# Patient Record
Sex: Female | Born: 1980 | Race: White | Hispanic: No | Marital: Married | Smoking: Never smoker
Health system: Southern US, Community
[De-identification: ages and names within clinical notes are randomized; demographics above are authoritative.]

## PROBLEM LIST (undated history)

## (undated) DIAGNOSIS — R87629 Unspecified abnormal cytological findings in specimens from vagina: Secondary | ICD-10-CM

## (undated) DIAGNOSIS — Z8742 Personal history of other diseases of the female genital tract: Secondary | ICD-10-CM

## (undated) DIAGNOSIS — Z8719 Personal history of other diseases of the digestive system: Secondary | ICD-10-CM

## (undated) DIAGNOSIS — R42 Dizziness and giddiness: Secondary | ICD-10-CM

## (undated) DIAGNOSIS — I499 Cardiac arrhythmia, unspecified: Secondary | ICD-10-CM

## (undated) HISTORY — PX: COLONOSCOPY: SHX174

## (undated) HISTORY — PX: DIAGNOSTIC LAPAROSCOPY: SUR761

---

## 2003-04-16 ENCOUNTER — Other Ambulatory Visit: Admission: RE | Admit: 2003-04-16 | Discharge: 2003-04-16 | Payer: Self-pay | Admitting: Family Medicine

## 2005-06-14 ENCOUNTER — Other Ambulatory Visit: Admission: RE | Admit: 2005-06-14 | Discharge: 2005-06-14 | Payer: Self-pay | Admitting: Family Medicine

## 2006-07-30 ENCOUNTER — Other Ambulatory Visit: Admission: RE | Admit: 2006-07-30 | Discharge: 2006-07-30 | Payer: Self-pay | Admitting: Family Medicine

## 2007-08-09 ENCOUNTER — Other Ambulatory Visit: Admission: RE | Admit: 2007-08-09 | Discharge: 2007-08-09 | Payer: Self-pay | Admitting: Family Medicine

## 2008-03-02 ENCOUNTER — Other Ambulatory Visit: Admission: RE | Admit: 2008-03-02 | Discharge: 2008-03-02 | Payer: Self-pay | Admitting: Family Medicine

## 2008-08-28 ENCOUNTER — Other Ambulatory Visit: Admission: RE | Admit: 2008-08-28 | Discharge: 2008-08-28 | Payer: Self-pay | Admitting: Family Medicine

## 2009-08-31 ENCOUNTER — Other Ambulatory Visit: Admission: RE | Admit: 2009-08-31 | Discharge: 2009-08-31 | Payer: Self-pay | Admitting: Family Medicine

## 2010-10-24 ENCOUNTER — Other Ambulatory Visit: Payer: Self-pay | Admitting: Family Medicine

## 2010-10-24 ENCOUNTER — Other Ambulatory Visit (HOSPITAL_COMMUNITY)
Admission: RE | Admit: 2010-10-24 | Discharge: 2010-10-24 | Disposition: A | Discharge status: Home / Self Care | Payer: Self-pay | Source: Ambulatory Visit | Attending: Family Medicine | Admitting: Family Medicine

## 2010-10-24 DIAGNOSIS — Z01419 Encounter for gynecological examination (general) (routine) without abnormal findings: Secondary | ICD-10-CM | POA: Insufficient documentation

## 2011-04-14 ENCOUNTER — Encounter (HOSPITAL_COMMUNITY): Payer: Self-pay | Admitting: *Deleted

## 2011-04-14 ENCOUNTER — Encounter (HOSPITAL_COMMUNITY): Payer: Self-pay | Admitting: Pharmacist

## 2011-04-16 ENCOUNTER — Other Ambulatory Visit: Payer: Self-pay | Admitting: Obstetrics and Gynecology

## 2011-04-17 ENCOUNTER — Other Ambulatory Visit: Payer: Self-pay | Admitting: Obstetrics and Gynecology

## 2011-04-17 ENCOUNTER — Ambulatory Visit (HOSPITAL_COMMUNITY): Payer: PRIVATE HEALTH INSURANCE

## 2011-04-17 ENCOUNTER — Encounter (HOSPITAL_COMMUNITY): Payer: Self-pay | Admitting: Anesthesiology

## 2011-04-17 ENCOUNTER — Encounter (HOSPITAL_COMMUNITY)
Admission: RE | Disposition: A | Discharge status: Home / Self Care | Payer: Self-pay | Source: Ambulatory Visit | Attending: Obstetrics and Gynecology

## 2011-04-17 ENCOUNTER — Ambulatory Visit (HOSPITAL_COMMUNITY): Payer: PRIVATE HEALTH INSURANCE | Admitting: Anesthesiology

## 2011-04-17 ENCOUNTER — Ambulatory Visit (HOSPITAL_COMMUNITY)
Admission: RE | Admit: 2011-04-17 | Discharge: 2011-04-17 | Disposition: A | Discharge status: Home / Self Care | Payer: PRIVATE HEALTH INSURANCE | Source: Ambulatory Visit | Attending: Obstetrics and Gynecology | Admitting: Obstetrics and Gynecology

## 2011-04-17 ENCOUNTER — Encounter (HOSPITAL_COMMUNITY): Payer: Self-pay | Admitting: *Deleted

## 2011-04-17 DIAGNOSIS — O021 Missed abortion: Secondary | ICD-10-CM | POA: Insufficient documentation

## 2011-04-17 DIAGNOSIS — Z9889 Other specified postprocedural states: Secondary | ICD-10-CM

## 2011-04-17 HISTORY — PX: DILATION AND EVACUATION: SHX1459

## 2011-04-17 LAB — CBC
Hemoglobin: 13.4 g/dL (ref 12.0–15.0)
Platelets: 154 10*3/uL (ref 150–400)
RBC: 4.22 MIL/uL (ref 3.87–5.11)
WBC: 9.6 10*3/uL (ref 4.0–10.5)

## 2011-04-17 LAB — HCG, QUANTITATIVE, PREGNANCY: hCG, Beta Chain, Quant, S: 83027 m[IU]/mL — ABNORMAL HIGH (ref <5)

## 2011-04-17 SURGERY — DILATION AND EVACUATION, UTERUS
Anesthesia: Monitor Anesthesia Care | Site: Vagina | Wound class: Clean Contaminated

## 2011-04-17 MED ORDER — MIDAZOLAM HCL 2 MG/2ML IJ SOLN
INTRAMUSCULAR | Status: AC
  Filled 2011-04-17: qty 2

## 2011-04-17 MED ORDER — PROPOFOL 10 MG/ML IV EMUL
INTRAVENOUS | Status: DC | PRN
  Administered 2011-04-17 (×2): 20 mg via INTRAVENOUS
  Administered 2011-04-17: 30 mg via INTRAVENOUS

## 2011-04-17 MED ORDER — 0.9 % SODIUM CHLORIDE (POUR BTL) OPTIME
TOPICAL | Status: DC | PRN
  Administered 2011-04-17: 1000 mL

## 2011-04-17 MED ORDER — KETOROLAC TROMETHAMINE 30 MG/ML IJ SOLN
INTRAMUSCULAR | Status: AC
  Filled 2011-04-17: qty 1

## 2011-04-17 MED ORDER — DOXYCYCLINE HYCLATE 100 MG PO TABS: ORAL_TABLET | ORAL | Status: DC

## 2011-04-17 MED ORDER — FENTANYL CITRATE 0.05 MG/ML IJ SOLN: 25.0000 ug | INTRAMUSCULAR | Status: DC | PRN

## 2011-04-17 MED ORDER — BUPIVACAINE HCL (PF) 0.25 % IJ SOLN
INTRAMUSCULAR | Status: DC | PRN
  Administered 2011-04-17: 30 mL

## 2011-04-17 MED ORDER — ACETAMINOPHEN 325 MG PO TABS: 325.0000 mg | ORAL_TABLET | ORAL | Status: DC | PRN

## 2011-04-17 MED ORDER — ONDANSETRON HCL 4 MG/2ML IJ SOLN
INTRAMUSCULAR | Status: AC
  Filled 2011-04-17: qty 2

## 2011-04-17 MED ORDER — FENTANYL CITRATE 0.05 MG/ML IJ SOLN
INTRAMUSCULAR | Status: DC | PRN
  Administered 2011-04-17 (×2): 50 ug via INTRAVENOUS

## 2011-04-17 MED ORDER — OXYCODONE-ACETAMINOPHEN 5-325 MG PO TABS: 1.0000 | ORAL_TABLET | Freq: Four times a day (QID) | ORAL | Status: AC | PRN

## 2011-04-17 MED ORDER — FENTANYL CITRATE 0.05 MG/ML IJ SOLN
INTRAMUSCULAR | Status: AC
  Filled 2011-04-17: qty 2

## 2011-04-17 MED ORDER — DEXAMETHASONE SODIUM PHOSPHATE 10 MG/ML IJ SOLN
INTRAMUSCULAR | Status: AC
  Filled 2011-04-17: qty 1

## 2011-04-17 MED ORDER — MIDAZOLAM HCL 5 MG/5ML IJ SOLN
INTRAMUSCULAR | Status: DC | PRN
  Administered 2011-04-17: 2 mg via INTRAVENOUS

## 2011-04-17 MED ORDER — LIDOCAINE HCL (CARDIAC) 20 MG/ML IV SOLN
INTRAVENOUS | Status: AC
  Filled 2011-04-17: qty 5

## 2011-04-17 MED ORDER — LACTATED RINGERS IV SOLN
INTRAVENOUS | Status: DC
  Administered 2011-04-17 (×2): via INTRAVENOUS

## 2011-04-17 MED ORDER — ONDANSETRON HCL 4 MG/2ML IJ SOLN
INTRAMUSCULAR | Status: DC | PRN
  Administered 2011-04-17: 4 mg via INTRAVENOUS

## 2011-04-17 MED ORDER — PROPOFOL 10 MG/ML IV EMUL
INTRAVENOUS | Status: AC
  Filled 2011-04-17: qty 20

## 2011-04-17 MED ORDER — DEXAMETHASONE SODIUM PHOSPHATE 4 MG/ML IJ SOLN
INTRAMUSCULAR | Status: DC | PRN
  Administered 2011-04-17: 10 mg via INTRAVENOUS

## 2011-04-17 MED ORDER — LIDOCAINE HCL (CARDIAC) 20 MG/ML IV SOLN
INTRAVENOUS | Status: DC | PRN
  Administered 2011-04-17 (×2): 50 mg via INTRAVENOUS

## 2011-04-17 MED ORDER — KETOROLAC TROMETHAMINE 30 MG/ML IJ SOLN
INTRAMUSCULAR | Status: DC | PRN
  Administered 2011-04-17: 30 mg via INTRAVENOUS

## 2011-04-17 MED ORDER — KETOROLAC TROMETHAMINE 30 MG/ML IJ SOLN: 15.0000 mg | Freq: Once | INTRAMUSCULAR | Status: DC | PRN

## 2011-04-17 MED ORDER — PROMETHAZINE HCL 25 MG/ML IJ SOLN: 6.2500 mg | INTRAMUSCULAR | Status: DC | PRN

## 2011-04-17 MED ORDER — IBUPROFEN 800 MG PO TABS: 800.0000 mg | ORAL_TABLET | Freq: Three times a day (TID) | ORAL | Status: AC | PRN

## 2011-04-17 SURGICAL SUPPLY — 21 items
CATH ROBINSON RED A/P 16FR (CATHETERS) ×2 IMPLANT
CLOTH BEACON ORANGE TIMEOUT ST (SAFETY) ×2 IMPLANT
DECANTER SPIKE VIAL GLASS SM (MISCELLANEOUS) ×2 IMPLANT
GLOVE BIOGEL M 6.5 STRL (GLOVE) ×2 IMPLANT
GLOVE BIOGEL PI IND STRL 6.5 (GLOVE) ×2 IMPLANT
GLOVE BIOGEL PI INDICATOR 6.5 (GLOVE) ×2
GOWN PREVENTION PLUS LG XLONG (DISPOSABLE) ×2 IMPLANT
GOWN PREVENTION PLUS XLARGE (GOWN DISPOSABLE) ×2 IMPLANT
KIT BERKELEY 1ST TRIMESTER 3/8 (MISCELLANEOUS) ×2 IMPLANT
NDL SPNL 22GX3.5 QUINCKE BK (NEEDLE) ×1 IMPLANT
NEEDLE SPNL 22GX3.5 QUINCKE BK (NEEDLE) ×2 IMPLANT
NS IRRIG 1000ML POUR BTL (IV SOLUTION) ×2 IMPLANT
PACK VAGINAL MINOR WOMEN LF (CUSTOM PROCEDURE TRAY) ×2 IMPLANT
PAD PREP 24X48 CUFFED NSTRL (MISCELLANEOUS) ×2 IMPLANT
SET BERKELEY SUCTION TUBING (SUCTIONS) ×2 IMPLANT
SYR CONTROL 10ML LL (SYRINGE) ×2 IMPLANT
TOWEL OR 17X24 6PK STRL BLUE (TOWEL DISPOSABLE) ×4 IMPLANT
VACURETTE 10 RIGID CVD (CANNULA) ×1 IMPLANT
VACURETTE 7MM CVD STRL WRAP (CANNULA) IMPLANT
VACURETTE 8 RIGID CVD (CANNULA) IMPLANT
VACURETTE 9 RIGID CVD (CANNULA) IMPLANT

## 2011-04-17 NOTE — Op Note (Signed)
04/17/2011  8:10 AM  PATIENT:  Rachel Welch  31 y.o. female  PRE-OPERATIVE DIAGNOSIS:  missed abortion  POST-OPERATIVE DIAGNOSIS:  missed abortion  PROCEDURE:  Procedure(s): DILATATION AND  Suction [place]   SURGEON:  Surgeon(s): Dorien Chihuahua. Richardson Dopp, MD  PHYSICIAN ASSISTANT: None   ASSISTANTS: none   ANESTHESIA:   IV sedation  EBL:  Total I/O In: 1000 [I.V.:1000] Out: 300 [Urine:250; Blood:50]  BLOOD ADMINISTERED:none  DRAINS: none   LOCAL MEDICATIONS USED:  MARCAINE 20CC  SPECIMEN:  Source of Specimen:  Products of Conception   DISPOSITION OF SPECIMEN:  PATHOLOGY  COUNTS:  YES  TOURNIQUET:  * No tourniquets in log *  DICTATION: .Note written in EPIC  PLAN OF CARE: Discharge to home after PACU  PATIENT DISPOSITION:  PACU - hemodynamically stable.  EBL 50 cc UOP 250 cc Fluids per anesthesia  Indication: G1 P0 at 10 wks and 2 days with missed AB desires D&C   Procedure : Patient was taken to the operating room where she was identified as Rachel Welch. She was placed under IV sedation. She is placed in the dorsolithotomy position. She was prepped and draped in the usual sterile fashion. Specials placed into the vaginal vault. Anterior lip of the cervix was grasped with a single-tooth tenaculum. 10 cc of quarter percent Marcaine were injected at the Cornelius clock positions. The uterus was then sounded and found to be 11 cm. The cervix was cervix was dilated to approximately 10 mm. A 10 mm suction curettte  was introduced and the products of conception were removed without difficulty. This was performed under ultrasound guidance. A gentle sharp curettage was performed. The  suction curet was reintroduced. All products of conception were removed from the uterus. All instruments were removed from the vagina excellent hemostasis was noted.  Patient was awakened from anesthesia taken to recovery wakened in stable condition sponge lap and needle counts were correct x2"  thank you   Delay start of Pharmacological VTE agent (>24hrs) due to surgical blood loss or risk of bleeding:NA

## 2011-04-17 NOTE — Transfer of Care (Signed)
Immediate Anesthesia Transfer of Care Note  Patient: Rachel Welch  Procedure(s) Performed:  DILATATION AND EVACUATION - with ultrasound guidance  Patient Location: PACU  Anesthesia Type: MAC  Level of Consciousness: awake and sedated  Airway & Oxygen Therapy: Patient Spontanous Breathing  Post-op Assessment: Report given to PACU RN and Post -op Vital signs reviewed and stable  Post vital signs: Reviewed and stable Filed Vitals:   04/17/11 0628  BP: 118/64  Pulse: 92  Temp: 36.8 C  Resp: 16    Complications: No apparent anesthesia complications

## 2011-04-17 NOTE — H&P (Addendum)
History reviewed. No pertinent past surgical history.Date of Initial H&P:04/13/2011  History reviewed, patient examined, no change in status, stable for surgery. Blood positive  Is O positive

## 2011-04-17 NOTE — Anesthesia Preprocedure Evaluation (Signed)
Anesthesia Evaluation  Patient identified by MRN, date of birth, ID band Patient awake    Reviewed: Allergy & Precautions, H&P , Patient's Chart, lab work & pertinent test results, reviewed documented beta blocker date and time   History of Anesthesia Complications Negative for: history of anesthetic complications  Airway Mallampati: II TM Distance: >3 FB Neck ROM: full    Dental No notable dental hx.    Pulmonary neg pulmonary ROS,  clear to auscultation  Pulmonary exam normal       Cardiovascular Exercise Tolerance: Good neg cardio ROS regular Normal    Neuro/Psych Negative Neurological ROS  Negative Psych ROS   GI/Hepatic negative GI ROS, Neg liver ROS, IBS   Endo/Other  Negative Endocrine ROS  Renal/GU negative Renal ROS     Musculoskeletal   Abdominal   Peds  Hematology negative hematology ROS (+)   Anesthesia Other Findings   Reproductive/Obstetrics negative OB ROS                           Anesthesia Physical Anesthesia Plan  ASA: II  Anesthesia Plan: MAC   Post-op Pain Management:    Induction:   Airway Management Planned:   Additional Equipment:   Intra-op Plan:   Post-operative Plan:   Informed Consent: I have reviewed the patients History and Physical, chart, labs and discussed the procedure including the risks, benefits and alternatives for the proposed anesthesia with the patient or authorized representative who has indicated his/her understanding and acceptance.   Dental Advisory Given  Plan Discussed with: CRNA, Surgeon and Anesthesiologist  Anesthesia Plan Comments:         Anesthesia Quick Evaluation

## 2011-04-18 NOTE — Anesthesia Postprocedure Evaluation (Signed)
  Anesthesia Post-op Note  Patient: Rachel Welch  Procedure(s) Performed:  DILATATION AND EVACUATION - with ultrasound guidance  Patient is awake and responsive. Pain and nausea are reasonably well controlled. Vital signs are stable and clinically acceptable. Oxygen saturation is clinically acceptable. There are no apparent anesthetic complications at this time. Patient is ready for discharge.

## 2011-04-19 ENCOUNTER — Encounter (HOSPITAL_COMMUNITY): Payer: Self-pay | Admitting: Obstetrics and Gynecology

## 2012-01-12 ENCOUNTER — Other Ambulatory Visit: Payer: Self-pay | Admitting: Obstetrics and Gynecology

## 2012-01-12 ENCOUNTER — Other Ambulatory Visit (HOSPITAL_COMMUNITY)
Admission: RE | Admit: 2012-01-12 | Discharge: 2012-01-12 | Disposition: A | Discharge status: Home / Self Care | Payer: Commercial Managed Care - PPO | Source: Ambulatory Visit | Attending: Obstetrics and Gynecology | Admitting: Obstetrics and Gynecology

## 2012-01-12 DIAGNOSIS — Z01419 Encounter for gynecological examination (general) (routine) without abnormal findings: Secondary | ICD-10-CM | POA: Insufficient documentation

## 2012-04-03 DIAGNOSIS — R42 Dizziness and giddiness: Secondary | ICD-10-CM

## 2012-04-03 HISTORY — DX: Dizziness and giddiness: R42

## 2012-09-11 ENCOUNTER — Other Ambulatory Visit: Payer: Self-pay | Admitting: Otolaryngology

## 2012-09-11 DIAGNOSIS — R42 Dizziness and giddiness: Secondary | ICD-10-CM

## 2012-09-11 DIAGNOSIS — R269 Unspecified abnormalities of gait and mobility: Secondary | ICD-10-CM

## 2012-09-22 ENCOUNTER — Ambulatory Visit
Admission: RE | Admit: 2012-09-22 | Discharge: 2012-09-22 | Disposition: A | Discharge status: Home / Self Care | Payer: Commercial Managed Care - PPO | Source: Ambulatory Visit | Attending: Otolaryngology | Admitting: Otolaryngology

## 2012-09-22 DIAGNOSIS — R269 Unspecified abnormalities of gait and mobility: Secondary | ICD-10-CM

## 2012-09-22 DIAGNOSIS — R42 Dizziness and giddiness: Secondary | ICD-10-CM

## 2012-09-22 MED ORDER — GADOBENATE DIMEGLUMINE 529 MG/ML IV SOLN
10.0000 mL | Freq: Once | INTRAVENOUS | Status: AC | PRN
  Administered 2012-09-22: 10 mL via INTRAVENOUS

## 2012-11-25 ENCOUNTER — Ambulatory Visit
Payer: Commercial Managed Care - PPO | Attending: Otolaryngology | Admitting: Rehabilitative and Restorative Service Providers"

## 2012-11-25 DIAGNOSIS — R42 Dizziness and giddiness: Secondary | ICD-10-CM | POA: Insufficient documentation

## 2012-11-25 DIAGNOSIS — IMO0001 Reserved for inherently not codable concepts without codable children: Secondary | ICD-10-CM | POA: Insufficient documentation

## 2012-12-09 ENCOUNTER — Ambulatory Visit
Payer: Commercial Managed Care - PPO | Attending: Otolaryngology | Admitting: Rehabilitative and Restorative Service Providers"

## 2012-12-09 DIAGNOSIS — IMO0001 Reserved for inherently not codable concepts without codable children: Secondary | ICD-10-CM | POA: Insufficient documentation

## 2012-12-09 DIAGNOSIS — R42 Dizziness and giddiness: Secondary | ICD-10-CM | POA: Insufficient documentation

## 2012-12-31 ENCOUNTER — Ambulatory Visit: Payer: Commercial Managed Care - PPO | Admitting: Rehabilitative and Restorative Service Providers"

## 2013-06-02 ENCOUNTER — Other Ambulatory Visit (HOSPITAL_COMMUNITY)
Admission: RE | Admit: 2013-06-02 | Discharge: 2013-06-02 | Disposition: A | Discharge status: Home / Self Care | Payer: Commercial Managed Care - PPO | Source: Ambulatory Visit | Attending: Obstetrics and Gynecology | Admitting: Obstetrics and Gynecology

## 2013-06-02 ENCOUNTER — Other Ambulatory Visit: Payer: Self-pay | Admitting: Obstetrics and Gynecology

## 2013-06-02 DIAGNOSIS — Z01419 Encounter for gynecological examination (general) (routine) without abnormal findings: Secondary | ICD-10-CM | POA: Insufficient documentation

## 2013-06-02 DIAGNOSIS — Z1151 Encounter for screening for human papillomavirus (HPV): Secondary | ICD-10-CM | POA: Insufficient documentation

## 2014-03-14 LAB — OB RESULTS CONSOLE HEPATITIS B SURFACE ANTIGEN: Hepatitis B Surface Ag: NEGATIVE

## 2014-03-14 LAB — OB RESULTS CONSOLE ABO/RH: RH Type: POSITIVE

## 2014-03-14 LAB — OB RESULTS CONSOLE RPR: RPR: NONREACTIVE

## 2014-03-14 LAB — OB RESULTS CONSOLE HIV ANTIBODY (ROUTINE TESTING): HIV: NONREACTIVE

## 2014-03-14 LAB — OB RESULTS CONSOLE RUBELLA ANTIBODY, IGM: RUBELLA: IMMUNE

## 2014-03-14 LAB — OB RESULTS CONSOLE ANTIBODY SCREEN: Antibody Screen: NEGATIVE

## 2014-08-08 ENCOUNTER — Telehealth: Payer: Self-pay
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
## 2014-09-25 ENCOUNTER — Encounter (HOSPITAL_COMMUNITY): Payer: Self-pay

## 2014-09-25 ENCOUNTER — Other Ambulatory Visit: Payer: Self-pay | Admitting: Obstetrics and Gynecology

## 2014-09-25 NOTE — Patient Instructions (Addendum)
  Your procedure is scheduled on:09/29/14  Enter through the Main Entrance at :12:45 pm Pick up desk phone and dial [phone] and inform us of your arrival.  Please call [phone] if you have any problems the morning of surgery.  Remember: Do not eat food after midnight:tonight Clear liquids are ok until:10 am   You may brush your teeth the morning of surgery.  DO NOT wear jewelry, eye make-up, lipstick,body lotion, or dark fingernail polish.  (Polished toes are ok) You may wear deodorant.  If you are to be admitted after surgery, leave suitcase in car until your room has been assigned. Patients discharged on the day of surgery will not be allowed to drive home. Wear loose fitting, comfortable clothes for your ride home.

## 2014-09-28 ENCOUNTER — Encounter (HOSPITAL_COMMUNITY): Payer: Self-pay

## 2014-09-28 ENCOUNTER — Encounter (HOSPITAL_COMMUNITY)
Admission: RE | Admit: 2014-09-28 | Discharge: 2014-09-28 | Disposition: A | Discharge status: Home / Self Care | Payer: BLUE CROSS/BLUE SHIELD | Source: Ambulatory Visit | Attending: Obstetrics and Gynecology | Admitting: Obstetrics and Gynecology

## 2014-09-28 DIAGNOSIS — Z01818 Encounter for other preprocedural examination: Secondary | ICD-10-CM | POA: Insufficient documentation

## 2014-09-28 HISTORY — DX: Dizziness and giddiness: R42

## 2014-09-28 HISTORY — DX: Cardiac arrhythmia, unspecified: I49.9

## 2014-09-28 LAB — ABO/RH: ABO/RH(D): O POS

## 2014-09-28 LAB — CBC
HEMATOCRIT: 35.9 % — AB (ref 36.0–46.0)
HEMOGLOBIN: 12.2 g/dL (ref 12.0–15.0)
MCH: 32.2 pg (ref 26.0–34.0)
MCHC: 34 g/dL (ref 30.0–36.0)
MCV: 94.7 fL (ref 78.0–100.0)
Platelets: 113 10*3/uL — ABNORMAL LOW (ref 150–400)
RBC: 3.79 MIL/uL — ABNORMAL LOW (ref 3.87–5.11)
RDW: 13.5 % (ref 11.5–15.5)
WBC: 7.5 10*3/uL (ref 4.0–10.5)

## 2014-09-28 MED ORDER — GENTAMICIN SULFATE 40 MG/ML IJ SOLN
INTRAVENOUS | Status: AC
  Administered 2014-09-29: 100 mL via INTRAVENOUS
  Filled 2014-09-28: qty 7.25

## 2014-09-29 ENCOUNTER — Inpatient Hospital Stay (HOSPITAL_COMMUNITY): Payer: BLUE CROSS/BLUE SHIELD | Admitting: Certified Registered Nurse Anesthetist

## 2014-09-29 ENCOUNTER — Other Ambulatory Visit: Payer: Self-pay | Admitting: Obstetrics and Gynecology

## 2014-09-29 ENCOUNTER — Encounter (HOSPITAL_COMMUNITY)
Admission: RE | Disposition: A | Discharge status: Home / Self Care | Payer: Self-pay | Source: Ambulatory Visit | Attending: Obstetrics and Gynecology

## 2014-09-29 ENCOUNTER — Inpatient Hospital Stay (HOSPITAL_COMMUNITY)
Admission: RE | Admit: 2014-09-29 | Discharge: 2014-10-02 | DRG: 766 | Disposition: A | Discharge status: Home / Self Care | Payer: BLUE CROSS/BLUE SHIELD | Source: Ambulatory Visit | Attending: Obstetrics and Gynecology | Admitting: Obstetrics and Gynecology

## 2014-09-29 DIAGNOSIS — O4413 Placenta previa with hemorrhage, third trimester: Secondary | ICD-10-CM | POA: Diagnosis present

## 2014-09-29 DIAGNOSIS — O9081 Anemia of the puerperium: Secondary | ICD-10-CM | POA: Diagnosis not present

## 2014-09-29 DIAGNOSIS — D649 Anemia, unspecified: Secondary | ICD-10-CM | POA: Diagnosis not present

## 2014-09-29 DIAGNOSIS — O322XX0 Maternal care for transverse and oblique lie, not applicable or unspecified: Secondary | ICD-10-CM | POA: Diagnosis present

## 2014-09-29 DIAGNOSIS — Z3A37 37 weeks gestation of pregnancy: Secondary | ICD-10-CM | POA: Diagnosis present

## 2014-09-29 DIAGNOSIS — Z98891 History of uterine scar from previous surgery: Secondary | ICD-10-CM

## 2014-09-29 HISTORY — DX: Personal history of other diseases of the digestive system: Z87.19

## 2014-09-29 HISTORY — DX: Personal history of other diseases of the female genital tract: Z87.42

## 2014-09-29 LAB — CBC
HCT: 39.1 % (ref 36.0–46.0)
Hemoglobin: 13.6 g/dL (ref 12.0–15.0)
MCH: 33 pg (ref 26.0–34.0)
MCHC: 34.8 g/dL (ref 30.0–36.0)
MCV: 94.9 fL (ref 78.0–100.0)
PLATELETS: 104 10*3/uL — AB (ref 150–400)
RBC: 4.12 MIL/uL (ref 3.87–5.11)
RDW: 13.6 % (ref 11.5–15.5)
WBC: 8.9 10*3/uL (ref 4.0–10.5)

## 2014-09-29 LAB — HEMOGLOBIN AND HEMATOCRIT, BLOOD
HEMATOCRIT: 36.6 % (ref 36.0–46.0)
Hemoglobin: 12.5 g/dL (ref 12.0–15.0)

## 2014-09-29 LAB — PREPARE RBC (CROSSMATCH)

## 2014-09-29 LAB — RPR: RPR: NONREACTIVE

## 2014-09-29 SURGERY — Surgical Case
Anesthesia: Spinal | Site: Abdomen

## 2014-09-29 MED ORDER — SCOPOLAMINE 1 MG/3DAYS TD PT72
1.0000 | MEDICATED_PATCH | Freq: Once | TRANSDERMAL | Status: DC
  Administered 2014-09-29: 1.5 mg via TRANSDERMAL

## 2014-09-29 MED ORDER — SODIUM CHLORIDE 0.9 % IV SOLN: Freq: Once | INTRAVENOUS | Status: DC

## 2014-09-29 MED ORDER — OXYTOCIN 10 UNIT/ML IJ SOLN
40.0000 [IU] | INTRAVENOUS | Status: DC | PRN
  Administered 2014-09-29: 40 [IU] via INTRAVENOUS

## 2014-09-29 MED ORDER — LACTATED RINGERS IV SOLN
INTRAVENOUS | Status: DC | PRN
  Administered 2014-09-29: 15:00:00 via INTRAVENOUS

## 2014-09-29 MED ORDER — ACETAMINOPHEN 500 MG PO TABS
1000.0000 mg | ORAL_TABLET | Freq: Four times a day (QID) | ORAL | Status: AC
  Administered 2014-09-29: 1000 mg via ORAL
  Filled 2014-09-29: qty 2

## 2014-09-29 MED ORDER — WITCH HAZEL-GLYCERIN EX PADS: 1.0000 "application " | MEDICATED_PAD | CUTANEOUS | Status: DC | PRN

## 2014-09-29 MED ORDER — PHENYLEPHRINE 8 MG IN D5W 100 ML (0.08MG/ML) PREMIX OPTIME
INJECTION | INTRAVENOUS | Status: DC | PRN
  Administered 2014-09-29: 60 ug/min via INTRAVENOUS

## 2014-09-29 MED ORDER — MORPHINE SULFATE 0.5 MG/ML IJ SOLN
INTRAMUSCULAR | Status: AC
  Filled 2014-09-29: qty 10

## 2014-09-29 MED ORDER — LACTATED RINGERS IV SOLN
INTRAVENOUS | Status: DC
  Administered 2014-09-29: 23:00:00 via INTRAVENOUS

## 2014-09-29 MED ORDER — OXYCODONE-ACETAMINOPHEN 5-325 MG PO TABS
1.0000 | ORAL_TABLET | ORAL | Status: DC | PRN
  Administered 2014-09-30 – 2014-10-02 (×5): 1 via ORAL
  Filled 2014-09-29 (×5): qty 1

## 2014-09-29 MED ORDER — SODIUM CHLORIDE 0.9 % IJ SOLN: 3.0000 mL | INTRAMUSCULAR | Status: DC | PRN

## 2014-09-29 MED ORDER — OXYCODONE-ACETAMINOPHEN 5-325 MG PO TABS
2.0000 | ORAL_TABLET | ORAL | Status: DC | PRN
  Administered 2014-09-30 – 2014-10-01 (×4): 2 via ORAL
  Filled 2014-09-29 (×4): qty 2

## 2014-09-29 MED ORDER — METHYLERGONOVINE MALEATE 0.2 MG/ML IJ SOLN
INTRAMUSCULAR | Status: DC | PRN
  Administered 2014-09-29: 0.2 mg via INTRAMUSCULAR

## 2014-09-29 MED ORDER — ONDANSETRON HCL 4 MG/2ML IJ SOLN
INTRAMUSCULAR | Status: AC
  Filled 2014-09-29: qty 2

## 2014-09-29 MED ORDER — CARBOPROST TROMETHAMINE 250 MCG/ML IM SOLN
INTRAMUSCULAR | Status: DC | PRN
  Administered 2014-09-29: 250 ug via INTRAMUSCULAR

## 2014-09-29 MED ORDER — NALBUPHINE HCL 10 MG/ML IJ SOLN: 5.0000 mg | Freq: Once | INTRAMUSCULAR | Status: AC | PRN

## 2014-09-29 MED ORDER — METHYLERGONOVINE MALEATE 0.2 MG PO TABS
0.2000 mg | ORAL_TABLET | ORAL | Status: DC | PRN
Start: 2014-09-29 — End: 2014-10-02

## 2014-09-29 MED ORDER — PRENATAL MULTIVITAMIN CH
1.0000 | ORAL_TABLET | Freq: Every day | ORAL | Status: DC
  Administered 2014-10-01 – 2014-10-02 (×2): 1 via ORAL
  Filled 2014-09-29 (×3): qty 1

## 2014-09-29 MED ORDER — ACETAMINOPHEN 325 MG PO TABS: 650.0000 mg | ORAL_TABLET | ORAL | Status: DC | PRN

## 2014-09-29 MED ORDER — SCOPOLAMINE 1 MG/3DAYS TD PT72
MEDICATED_PATCH | TRANSDERMAL | Status: AC
  Administered 2014-09-29: 1.5 mg via TRANSDERMAL
  Filled 2014-09-29: qty 1

## 2014-09-29 MED ORDER — MEPERIDINE HCL 25 MG/ML IJ SOLN: 6.2500 mg | INTRAMUSCULAR | Status: DC | PRN

## 2014-09-29 MED ORDER — NALBUPHINE HCL 10 MG/ML IJ SOLN: 5.0000 mg | INTRAMUSCULAR | Status: DC | PRN

## 2014-09-29 MED ORDER — SIMETHICONE 80 MG PO CHEW: 80.0000 mg | CHEWABLE_TABLET | ORAL | Status: DC | PRN

## 2014-09-29 MED ORDER — LACTATED RINGERS IV SOLN
INTRAVENOUS | Status: DC | PRN
  Administered 2014-09-29 (×2): via INTRAVENOUS

## 2014-09-29 MED ORDER — METHYLERGONOVINE MALEATE 0.2 MG/ML IJ SOLN: 0.2000 mg | INTRAMUSCULAR | Status: DC | PRN

## 2014-09-29 MED ORDER — NALOXONE HCL 1 MG/ML IJ SOLN: 1.0000 ug/kg/h | INTRAVENOUS | Status: DC | PRN

## 2014-09-29 MED ORDER — ZOLPIDEM TARTRATE 5 MG PO TABS: 5.0000 mg | ORAL_TABLET | Freq: Every evening | ORAL | Status: DC | PRN

## 2014-09-29 MED ORDER — LANOLIN HYDROUS EX OINT
1.0000 | TOPICAL_OINTMENT | CUTANEOUS | Status: DC | PRN
Start: 2014-09-29 — End: 2014-10-02

## 2014-09-29 MED ORDER — FENTANYL CITRATE (PF) 100 MCG/2ML IJ SOLN
25.0000 ug | INTRAMUSCULAR | Status: DC | PRN
Start: 2014-09-29 — End: 2014-09-29
  Administered 2014-09-29: 50 ug via INTRAVENOUS

## 2014-09-29 MED ORDER — LACTATED RINGERS IV SOLN
Freq: Once | INTRAVENOUS | Status: AC
  Administered 2014-09-29: 13:00:00 via INTRAVENOUS

## 2014-09-29 MED ORDER — SCOPOLAMINE 1 MG/3DAYS TD PT72: 1.0000 | MEDICATED_PATCH | Freq: Once | TRANSDERMAL | Status: DC

## 2014-09-29 MED ORDER — PHENYLEPHRINE 8 MG IN D5W 100 ML (0.08MG/ML) PREMIX OPTIME
INJECTION | INTRAVENOUS | Status: AC
  Filled 2014-09-29: qty 100

## 2014-09-29 MED ORDER — DIPHENHYDRAMINE HCL 25 MG PO CAPS: 25.0000 mg | ORAL_CAPSULE | ORAL | Status: DC | PRN

## 2014-09-29 MED ORDER — NALOXONE HCL 0.4 MG/ML IJ SOLN
0.4000 mg | INTRAMUSCULAR | Status: DC | PRN
Start: 2014-09-29 — End: 2014-10-02

## 2014-09-29 MED ORDER — BUPIVACAINE IN DEXTROSE 0.75-8.25 % IT SOLN
INTRATHECAL | Status: DC | PRN
  Administered 2014-09-29: 1.4 mL via INTRATHECAL

## 2014-09-29 MED ORDER — SIMETHICONE 80 MG PO CHEW
80.0000 mg | CHEWABLE_TABLET | Freq: Three times a day (TID) | ORAL | Status: DC
  Administered 2014-09-30 – 2014-10-02 (×6): 80 mg via ORAL
  Filled 2014-09-29 (×6): qty 1

## 2014-09-29 MED ORDER — FENTANYL CITRATE (PF) 100 MCG/2ML IJ SOLN
INTRAMUSCULAR | Status: AC
  Filled 2014-09-29: qty 2

## 2014-09-29 MED ORDER — OXYTOCIN 10 UNIT/ML IJ SOLN
INTRAMUSCULAR | Status: AC
  Filled 2014-09-29: qty 4

## 2014-09-29 MED ORDER — SENNOSIDES-DOCUSATE SODIUM 8.6-50 MG PO TABS
2.0000 | ORAL_TABLET | ORAL | Status: DC
Start: 2014-09-30 — End: 2014-10-02
  Administered 2014-09-30 – 2014-10-01 (×3): 2 via ORAL
  Filled 2014-09-29 (×3): qty 2

## 2014-09-29 MED ORDER — ONDANSETRON HCL 4 MG/2ML IJ SOLN: 4.0000 mg | Freq: Three times a day (TID) | INTRAMUSCULAR | Status: DC | PRN

## 2014-09-29 MED ORDER — MORPHINE SULFATE (PF) 0.5 MG/ML IJ SOLN
INTRAMUSCULAR | Status: DC | PRN
  Administered 2014-09-29: .1 mg via INTRATHECAL

## 2014-09-29 MED ORDER — SIMETHICONE 80 MG PO CHEW
80.0000 mg | CHEWABLE_TABLET | ORAL | Status: DC
Start: 2014-09-30 — End: 2014-10-02
  Administered 2014-09-30 – 2014-10-02 (×3): 80 mg via ORAL
  Filled 2014-09-29 (×3): qty 1

## 2014-09-29 MED ORDER — FENTANYL CITRATE (PF) 100 MCG/2ML IJ SOLN
INTRAMUSCULAR | Status: DC | PRN
  Administered 2014-09-29: 50 ug via INTRAVENOUS
  Administered 2014-09-29: 25 ug via INTRATHECAL

## 2014-09-29 MED ORDER — DIPHENHYDRAMINE HCL 25 MG PO CAPS: 25.0000 mg | ORAL_CAPSULE | Freq: Four times a day (QID) | ORAL | Status: DC | PRN

## 2014-09-29 MED ORDER — MENTHOL 3 MG MT LOZG: 1.0000 | LOZENGE | OROMUCOSAL | Status: DC | PRN

## 2014-09-29 MED ORDER — ONDANSETRON HCL 4 MG/2ML IJ SOLN
INTRAMUSCULAR | Status: DC | PRN
Start: 2014-09-29 — End: 2014-09-29
  Administered 2014-09-29: 4 mg via INTRAVENOUS

## 2014-09-29 MED ORDER — FERROUS SULFATE 325 (65 FE) MG PO TABS
325.0000 mg | ORAL_TABLET | Freq: Two times a day (BID) | ORAL | Status: DC
  Administered 2014-09-30 – 2014-10-02 (×5): 325 mg via ORAL
  Filled 2014-09-29 (×5): qty 1

## 2014-09-29 MED ORDER — DIBUCAINE 1 % RE OINT: 1.0000 "application " | TOPICAL_OINTMENT | RECTAL | Status: DC | PRN

## 2014-09-29 MED ORDER — IBUPROFEN 600 MG PO TABS
600.0000 mg | ORAL_TABLET | Freq: Four times a day (QID) | ORAL | Status: DC
  Administered 2014-09-30 – 2014-10-02 (×10): 600 mg via ORAL
  Filled 2014-09-29 (×10): qty 1

## 2014-09-29 MED ORDER — 0.9 % SODIUM CHLORIDE (POUR BTL) OPTIME
TOPICAL | Status: DC | PRN
  Administered 2014-09-29: 2000 mL

## 2014-09-29 MED ORDER — OXYTOCIN 40 UNITS IN LACTATED RINGERS INFUSION - SIMPLE MED: 62.5000 mL/h | INTRAVENOUS | Status: AC

## 2014-09-29 MED ORDER — DIPHENHYDRAMINE HCL 50 MG/ML IJ SOLN
12.5000 mg | INTRAMUSCULAR | Status: DC | PRN
Start: 2014-09-29 — End: 2014-10-02

## 2014-09-29 SURGICAL SUPPLY — 43 items
APL SKNCLS STERI-STRIP NONHPOA (GAUZE/BANDAGES/DRESSINGS) ×1
BARRIER ADHS 3X4 INTERCEED (GAUZE/BANDAGES/DRESSINGS) ×2 IMPLANT
BENZOIN TINCTURE PRP APPL 2/3 (GAUZE/BANDAGES/DRESSINGS) ×3 IMPLANT
BRR ADH 4X3 ABS CNTRL BYND (GAUZE/BANDAGES/DRESSINGS) ×1
CLAMP CORD UMBIL (MISCELLANEOUS) IMPLANT
CLOSURE WOUND 1/2 X4 (GAUZE/BANDAGES/DRESSINGS) ×1
CLOTH BEACON ORANGE TIMEOUT ST (SAFETY) ×3 IMPLANT
CONTAINER PREFILL 10% NBF 15ML (MISCELLANEOUS) IMPLANT
DRAPE SHEET LG 3/4 BI-LAMINATE (DRAPES) ×2 IMPLANT
DRSG OPSITE POSTOP 4X10 (GAUZE/BANDAGES/DRESSINGS) ×3 IMPLANT
DURAPREP 26ML APPLICATOR (WOUND CARE) ×3 IMPLANT
ELECT REM PT RETURN 9FT ADLT (ELECTROSURGICAL) ×3
ELECTRODE REM PT RTRN 9FT ADLT (ELECTROSURGICAL) ×1 IMPLANT
EXTRACTOR VACUUM M CUP 4 TUBE (SUCTIONS) IMPLANT
EXTRACTOR VACUUM M CUP 4' TUBE (SUCTIONS)
GLOVE BIOGEL M 6.5 STRL (GLOVE) ×6 IMPLANT
GLOVE BIOGEL PI IND STRL 6.5 (GLOVE) ×1 IMPLANT
GLOVE BIOGEL PI INDICATOR 6.5 (GLOVE) ×2
GOWN STRL REUS W/TWL LRG LVL3 (GOWN DISPOSABLE) ×9 IMPLANT
KIT ABG SYR 3ML LUER SLIP (SYRINGE) IMPLANT
NDL HYPO 25X5/8 SAFETYGLIDE (NEEDLE) IMPLANT
NEEDLE HYPO 25X5/8 SAFETYGLIDE (NEEDLE) IMPLANT
NS IRRIG 1000ML POUR BTL (IV SOLUTION) ×3 IMPLANT
PACK C SECTION WH (CUSTOM PROCEDURE TRAY) ×3 IMPLANT
PAD ABD 7.5X8 STRL (GAUZE/BANDAGES/DRESSINGS) ×2 IMPLANT
PAD OB MATERNITY 4.3X12.25 (PERSONAL CARE ITEMS) ×3 IMPLANT
RTRCTR C-SECT PINK 25CM LRG (MISCELLANEOUS) ×2 IMPLANT
RTRCTR C-SECT PINK 34CM XLRG (MISCELLANEOUS) IMPLANT
SPONGE GAUZE 4X4 12PLY STER LF (GAUZE/BANDAGES/DRESSINGS) ×4 IMPLANT
STAPLER VISISTAT 35W (STAPLE) IMPLANT
STRIP CLOSURE SKIN 1/2X4 (GAUZE/BANDAGES/DRESSINGS) ×2 IMPLANT
SUT PDS AB 0 CT1 27 (SUTURE) ×6 IMPLANT
SUT PLAIN 0 NONE (SUTURE) IMPLANT
SUT VIC AB 0 CTX 36 (SUTURE) ×15
SUT VIC AB 0 CTX36XBRD ANBCTRL (SUTURE) ×3 IMPLANT
SUT VIC AB 2-0 CT1 27 (SUTURE) ×3
SUT VIC AB 2-0 CT1 TAPERPNT 27 (SUTURE) ×1 IMPLANT
SUT VIC AB 3-0 SH 27 (SUTURE) ×3
SUT VIC AB 3-0 SH 27X BRD (SUTURE) IMPLANT
SUT VIC AB 4-0 KS 27 (SUTURE) ×3 IMPLANT
TAPE CLOTH SURG 4X10 WHT LF (GAUZE/BANDAGES/DRESSINGS) ×2 IMPLANT
TOWEL OR 17X24 6PK STRL BLUE (TOWEL DISPOSABLE) ×3 IMPLANT
TRAY FOLEY CATH SILVER 14FR (SET/KITS/TRAYS/PACK) ×3 IMPLANT

## 2014-09-29 NOTE — Op Note (Addendum)
Cesarean Section Procedure Note  Indications: placenta previa  Pre-operative Diagnosis: 37 week 0 day pregnancy.  Post-operative Diagnosis: same  Surgeon: Jessee AversOLE,[place]odia [place] J. M.D.  Assistants: Dr. Geryl RankinsEvelyn Varnado  asisted due to the complexity of the surgery and concern for massive  Hemorrhage due to previa .   Anesthesia: Spinal anesthesia  ASA Class: 2   Procedure Details   The patient was seen in the Holding Room. The risks, benefits, complications, treatment options, and expected outcomes were discussed with the patient.  The patient concurred with the proposed plan, giving informed consent.  The site of surgery properly noted/marked. The patient was taken to Operating Room # 9, identified as Rachel Welch and the procedure verified as C-Section Delivery. A Time Out was held and the above information confirmed.  After induction of anesthesia, the patient was draped and prepped in the usual sterile manner. A Pfannenstiel incision was made and carried down through the subcutaneous tissue to the fascia. Fascial incision was made and extended transversely. The fascia was separated from the underlying rectus tissue superiorly and inferiorly. The peritoneum was identified and entered. Peritoneal incision was extended longitudinally. The utero-vesical peritoneal reflection was incised transversely and the bladder flap was bluntly freed from the lower uterine segment. A low transverse uterine incision was made. Delivered from transverse presentation was a  Female with Apgar scores of 9 at one minute and 9 at five minutes. After the umbilical cord was clamped and cut cord blood was obtained for evaluation. The placenta was removed intact and appeared normal. The uterine outline, tubes and ovaries appeared normal.  She had uterine atony.. Methergine 0.2 mg was given IM and 1 amp of hemabate was given IM. The uterine incision was closed with running locked sutures of 0 vicryl. a second layer of the same  suture was used to imbricate the incision . Hemostasis was observed. Lavage was carried out until clear. The fascia was then reapproximated with running sutures of 0 pds. The skin was reapproximated with 4-0 vicryl .  Instrument, sponge, and needle counts were correct prior the abdominal closure and at the conclusion of the case.   Findings: Female infant in the transverse presentation.  Normal fallopian tubes and ovaries.   Estimated Blood Loss:  1200 CC         Drains: Foley  UOP 100 cc          Total IV Fluids:  Per anesthesia ml         Specimens: Placenta and sent to labor and delivery            Implants: None         Complications:  None; patient tolerated the procedure well.         Disposition: PACU - hemodynamically stable.         Condition: stable  Attending Attestation: I performed the procedure.

## 2014-09-29 NOTE — H&P (Deleted)
  The note originally documented on this encounter has been moved the the encounter in which it belongs.  

## 2014-09-29 NOTE — H&P (Signed)
Rachel Welch is a 34 y.o. female presenting for  Primary cesarean section at [redacted] wks EGA based on LMP confirmed by u/s with EDD 10/20/2014. Pregnacy complicated by Complete placenta previa.She has malpresentation transverse . Pt has not had any vaginal bleeding during her pregnancy. + FM no contractions.   History OB History    Gravida Para Term Preterm AB TAB SAB Ectopic Multiple Living   2    1     0     Past Medical History  Diagnosis Date  . Dysrhythmia     occ skipped beat  . Vertigo 2014    x 6 mos   Past Surgical History  Procedure Laterality Date  . Dilation and evacuation  04/17/2011    Procedure: DILATATION AND EVACUATION;  Surgeon: Dorien Chihuahuaara J. Richardson Doppole, MD;  Location: [facility];  Service: Gynecology;  Laterality: N/A;  with ultrasound guidance  . Diagnostic laparoscopy     Family History: family history is not on file. Social History:  reports that she has never smoked. She does not have any smokeless tobacco history on file. She reports that she does not drink alcohol or use illicit drugs. Allergies : sulfa and Penicillin   Prenatal Transfer Tool  Maternal Diabetes: No Genetic Screening: Normal Maternal Ultrasounds/Referrals: Abnormal:  Findings:   Other: placenta previa  Fetal Ultrasounds or other Referrals:  None Maternal Substance Abuse:  No Significant Maternal Medications:  None Significant Maternal Lab Results:  Lab values include: Group B Strep negative Other Comments:  None  Review of Systems  Constitutional: Negative.   HENT: Negative.   Eyes: Negative.   Respiratory: Negative.   Cardiovascular: Negative.   Gastrointestinal: Negative.   Genitourinary: Negative.   Musculoskeletal: Negative.   Skin: Negative.   Neurological: Negative.   Endo/Heme/Allergies: Negative.   Psychiatric/Behavioral: Negative.       Last menstrual period 01/15/2014. Exam Physical Exam  Vitals reviewed. Constitutional: She is oriented to person, place, and time. She appears  well-developed and well-nourished.  HENT:  Head: Normocephalic and atraumatic.  Neck: Normal range of motion. Neck supple.  Cardiovascular: Normal rate and regular rhythm.   Respiratory: Effort normal and breath sounds normal.  GI: There is no tenderness.  Musculoskeletal: Normal range of motion. She exhibits no edema.  Neurological: She is alert and oriented to person, place, and time. She has normal reflexes.  Skin: Skin is warm and dry.  Psychiatric: She has a normal mood and affect.    Prenatal labs: ABO, Rh: --/--/O POS, O POS (06/27 1225) Antibody: NEG (06/27 1225) Rubella: Immune (12/12 0000) RPR: Non Reactive (06/27 1225)  HBsAg: Negative (12/12 0000)  HIV: Non-reactive (12/12 0000)  GBS:   Negative   Assessment/Plan: 37 wks   And 0 days EGA with complete previa and malpresentation of fetus.  Plan primary cesarean section. D/w pt r/o surgery infection/ bleeding/ damage to bowel bladder baby with the need for further surgery. R/o Transfusion HIV / hep B&C discussed.  Pt informed of increased r/o accreta with previa and possible need for cesarean hysterectomy.  She voiced understanding and accepts the above risk. She desires to proceed.    Rachel Welch J. 09/29/2014, 12:44 PM

## 2014-09-29 NOTE — Progress Notes (Signed)
Pt dizzy after 5 min standing at bedside at 2055 at 6hrs after delivery. Low B/P when standing back to bed for pad change.

## 2014-09-29 NOTE — Anesthesia Procedure Notes (Signed)

## 2014-09-29 NOTE — Anesthesia Preprocedure Evaluation (Addendum)
Anesthesia Evaluation  Patient identified by MRN, date of birth, ID band Patient awake    Reviewed: Allergy & Precautions, H&P , Patient's Chart, lab work & pertinent test results  Airway Mallampati: II  TM Distance: >3 FB Neck ROM: full    Dental no notable dental hx.    Pulmonary  breath sounds clear to auscultation  Pulmonary exam normal       Cardiovascular Exercise Tolerance: Good Rhythm:regular Rate:Normal     Neuro/Psych    GI/Hepatic   Endo/Other    Renal/GU      Musculoskeletal   Abdominal   Peds  Hematology   Anesthesia Other Findings Discussed low platelets and asked Dr Richardson Doppole if she wanted to wait till platelets were here. She wishes to proceed with CS  Reproductive/Obstetrics                           Anesthesia Physical Anesthesia Plan  ASA: II  Anesthesia Plan: Spinal   Post-op Pain Management:    Induction:   Airway Management Planned:   Additional Equipment:   Intra-op Plan:   Post-operative Plan:   Informed Consent: I have reviewed the patients History and Physical, chart, labs and discussed the procedure including the risks, benefits and alternatives for the proposed anesthesia with the patient or authorized representative who has indicated his/her understanding and acceptance.   Dental Advisory Given  Plan Discussed with: CRNA  Anesthesia Plan Comments: (Lab work confirmed with CRNA in room. Platelets okay. Discussed spinal anesthetic, and patient consents to the procedure:  included risk of possible headache,backache, failed block, allergic reaction, and nerve injury. This patient was asked if she had any questions or concerns before the procedure started. )        Anesthesia Quick Evaluation

## 2014-09-29 NOTE — Transfer of Care (Signed)
Immediate Anesthesia Transfer of Care Note  Patient: Rachel Welch  Procedure(s) Performed: Procedure(s): CESAREAN SECTION (N/A)  Patient Location: PACU  Anesthesia Type:Spinal  Level of Consciousness: awake, alert  and oriented  Airway & Oxygen Therapy: Patient Spontanous Breathing  Post-op Assessment: Report given to RN and Post -op Vital signs reviewed and stable  Post vital signs: Reviewed and stable  Last Vitals:  Filed Vitals:   09/29/14 1245  Pulse: 71  Temp: 36.5 C  Resp: 20    Complications: No apparent anesthesia complications

## 2014-09-30 ENCOUNTER — Encounter (HOSPITAL_COMMUNITY): Payer: Self-pay | Admitting: *Deleted

## 2014-09-30 LAB — CBC
HCT: 27.4 % — ABNORMAL LOW (ref 36.0–46.0)
Hemoglobin: 9.3 g/dL — ABNORMAL LOW (ref 12.0–15.0)
MCH: 31.7 pg (ref 26.0–34.0)
MCHC: 33.9 g/dL (ref 30.0–36.0)
MCV: 93.5 fL (ref 78.0–100.0)
Platelets: 94 10*3/uL — ABNORMAL LOW (ref 150–400)
RBC: 2.93 MIL/uL — AB (ref 3.87–5.11)
RDW: 13.5 % (ref 11.5–15.5)
WBC: 11.8 10*3/uL — AB (ref 4.0–10.5)

## 2014-09-30 LAB — BIRTH TISSUE RECOVERY COLLECTION (PLACENTA DONATION)

## 2014-09-30 NOTE — Lactation Note (Signed)
This note was copied from the chart of Boy Beatriz ChancellorElizabeth Sayavong. Lactation Consultation Note  Baby was in nursery for circumcision. Hand expression taught to mom with colostrum easily expressible.  Baby is 37 weeks and <6# so hand expression and spoon feeding was encouraged.  Follow-up after circumcision.  Patient Name: Boy Beatriz Chancellorlizabeth Grimes [id number]'s Date: 09/30/2014 Reason for consult: Initial assessment   Maternal Data Has patient been taught Hand Expression?: Yes Does the patient have breastfeeding experience prior to this delivery?: No  Feeding    LATCH Score/Interventions                      Lactation Tools Discussed/Used     Consult Status      Soyla DryerJoseph, Jamise Pentland 09/30/2014, 12:41 PM

## 2014-09-30 NOTE — Progress Notes (Signed)
Subjective: Postpartum Day 1: Cesarean Delivery Patient reports incisional pain and tolerating PO.   Pain improved with 1 percocet.   Objective: Vital signs in last 24 hours: Temp:  [97.3 F (36.3 C)-98.5 F (36.9 C)] 97.8 F (36.6 C) (06/29 0546) Pulse Rate:  [67-90] 68 (06/29 0546) Resp:  [10-24] 20 (06/29 0546) BP: (74-129)/(42-88) 88/46 mmHg (06/29 0546) SpO2:  [94 %-100 %] 94 % (06/29 0546)  Physical Exam:  General: alert and cooperative Lochia: appropriate Uterine Fundus: firm Incision: bandage clean dry  And intact DVT Evaluation: No evidence of DVT seen on physical exam.   Recent Labs  09/29/14 1635 09/30/14 0620  HGB 12.5 9.3*  HCT 36.6 27.4*    Assessment/Plan: Status post Cesarean section. Doing well postoperatively.  Anemia.. Iron supplement BID  Encouraged ambulation.  Pt desires circumcision of her son. R/b/a of circumcision discussed. [name].  Maelie Chriswell J. 09/30/2014, 12:14 PM

## 2014-10-01 LAB — PREPARE PLATELET PHERESIS: Unit division: 0

## 2014-10-01 NOTE — Discharge Summary (Signed)
Obstetric Discharge Summary Reason for Admission: cesarean section Prenatal Procedures: none Intrapartum Procedures: cesarean: low cervical, transverse Postpartum Procedures: none Complications-Operative and Postpartum: hemorrhage HEMOGLOBIN  Date Value Ref Range Status  09/30/2014 9.3* 12.0 - 15.0 g/dL Final    Comment:    REPEATED TO VERIFY DELTA CHECK NOTED    HCT  Date Value Ref Range Status  09/30/2014 27.4* 36.0 - 46.0 % Final     Discharge Diagnoses: Term Pregnancy-delivered  Discharge Information: Date: 10/02/2014  Activity: pelvic rest Diet: routine Medications: PNV, Ibuprofen, Iron and Percocet Condition: stable Instructions: refer to practice specific booklet Discharge to: home Follow-up Information    Follow up with Jessee AversOLE,Taylen Wendland J., MD. Schedule an appointment as soon as possible for a visit in 2 weeks.   Specialty:  Obstetrics and Gynecology   Why:  incision check    Contact information:   [address] [place] [address] [phone]       Newborn Data: Live born female  Birth Weight: 5 lb 13.8 oz (2659 g) APGAR: 9, 9  Home with mother.  Kore Madlock J. 10/01/2014, 9:38 PM

## 2014-10-01 NOTE — Lactation Note (Signed)
This note was copied from the chart of [name]'s Date: 10/01/2014 Reason for consult: Follow-up assessment;Infant < 6lbs;Other (Comment) (37.0 GA)   Follow-up consult at 51 hours; GA 37.0; BW 5 lbs, 13.8 oz.  Mom is a P1.  Only 4% weight loss last night at 33 hours old. Infant has breastfed x7 (15-35 min) + attempts x2 (0 min) in past 24 hours; voids-4 in 24 hours/7 life; stools-3 in 24 hours/ 9 life.  LS-8 by RN. RN had just brought HP into room to give mom when LC came in.  Mom's right nipple dimples inward and infant has a difficult time maintaining latch. [facility] taught mom how to use Hand pump for a few minutes and right nipple erected. LC reviewed hand expression and taught mom to latch with asymmetrical latching technique.   Infant latched on right breast Football hold, took a few sucks and would lose latch.  Mom's breasts are filling/firm so infant was having difficulty maintaining latch.   After a few on/off episodes d/t mom's firm breasts, infant latched and stayed latched.   Some chomping motion noted but after milk began flowing and chin tug, infant sucked in a consistent rhythm with several swallows heard throughout feeding.  LS-9.  Taught dad how to assist with latching using teacup hold and supporting mom's hands.  Taught dad how to tug on chin to increase depth on breast and flange bottom lips.   Gave parents curved tip syringe for using EBM collected and giving back EBM at breast.  Discussed how to use curved-tip at breast. Mom reports hand expressing and feeding back 1-2 ml of colostrum to infant via spoon.  Mom had a colostrum collection container at sink with ~1-2 ml collected.    Encouraged parents to continue feeding with cues, but also waking every 3 hours if infant is sleeping for feedings since infant is 37.0 GA & <6 lbs.  Engorgement prevention discussed.    Parents verbalized understanding  and was appreciative of teaching/help from [facility]. Encouraged parents to call for assistance as necessary.  Infant was still feeding consistently when LC left room 30 minutes later.   Report of consult given to RN.     Maternal Data Has patient been taught Hand Expression?: Yes  Feeding Feeding Type: Breast Fed Length of feed: 30 min  LATCH Score/Interventions Latch: Grasps breast easily, tongue down, lips flanged, rhythmical sucking.  Audible Swallowing: Spontaneous and intermittent  Type of Nipple: Everted at rest and after stimulation (right nipple dimples in center but will erect with stimulation) Intervention(s): Hand pump  Comfort (Breast/Nipple): Soft / non-tender     Hold (Positioning): Assistance needed to correctly position infant at breast and maintain latch.  LATCH Score: 9  Lactation Tools Discussed/Used Pump Review: Setup, frequency, and cleaning;Milk Storage Initiated by:: RN Date initiated:: 10/01/14   Consult Status Consult Status: Follow-up Date: 10/02/14 Follow-up type: In-patient    Lendon KaVann, Lorrain Rivers Walker 10/01/2014, 6:32 PM

## 2014-10-01 NOTE — Anesthesia Postprocedure Evaluation (Signed)
  Anesthesia Post-op Note  Patient: Rachel ChancellorElizabeth Welch  Procedure(s) Performed: Procedure(s): CESAREAN SECTION (N/A)  Patient is awake, responsive, moving her legs, and has signs of resolution of her numbness. Pain and nausea are reasonably well controlled. Vital signs are stable and clinically acceptable. Oxygen saturation is clinically acceptable. There are no apparent anesthetic complications at this time. Patient is ready for discharge.

## 2014-10-01 NOTE — Progress Notes (Signed)
Subjective: Postpartum Day 2: Cesarean Delivery Patient reports tolerating PO, + flatus and no problems voiding.    Objective: Vital signs in last 24 hours: Temp:  [98 F (36.7 C)-98.5 F (36.9 C)] 98 F (36.7 C) (06/30 0615) Pulse Rate:  [77-81] 81 (06/30 0615) Resp:  [16-18] 16 (06/30 0615) BP: (92-99)/(42-51) 99/51 mmHg (06/30 0615)  Physical Exam:  General: alert and cooperative Lochia: appropriate Uterine Fundus: firm Incision: healing well DVT Evaluation: No evidence of DVT seen on physical exam.   Recent Labs  09/29/14 1635 09/30/14 0620  HGB 12.5 9.3*  HCT 36.6 27.4*    Assessment/Plan: Status post Cesarean section. Doing well postoperatively.  Continue current care Plan discharge home tomorrow. Dr. Dion BodyVarnado covering 10/02/2014  Rachel Weismann J. 10/01/2014, 1:59 PM

## 2014-10-02 LAB — TYPE AND SCREEN
ABO/RH(D): O POS
Antibody Screen: NEGATIVE
UNIT DIVISION: 0
UNIT DIVISION: 0

## 2014-10-02 MED ORDER — IBUPROFEN 600 MG PO TABS: 600.0000 mg | ORAL_TABLET | Freq: Four times a day (QID) | ORAL | Status: DC

## 2014-10-02 MED ORDER — OXYCODONE-ACETAMINOPHEN 5-325 MG PO TABS: ORAL_TABLET | ORAL | Status: DC

## 2014-10-02 NOTE — Lactation Note (Addendum)
This note was copied from the chart of [name]. Lactation Consultation Note  Patient Name: [name] ZOXWR'UToday's Date: 10/02/2014 Reason for consult: Follow-up assessment  with this mom of an early term baby, now 8237 4/7 weeks CGA. Mom has a great milk supply - she has pumped 40-50 ml's of transitional milk.  Mom has been supplementing with syringe, but was willing to use bottle with slow flow nipple today. i advised them to offer 30 ml's, and to let him tke the amount he wants. They can increase amount from now on, as baby cues for.  Mom is extremely engorged. I showed her how to do massage from her nipple towards her armpit, using vaseline in hostal, but coconut or olive oil at home. I also gave mom ice and instructed her in it's use. I advised mom to stop massage and pump if her milk begins to flow. She will call for help with nipple shield application, and latching, later this morning. I also brought mom comofort gels, and will instruct her in their use also.  Dad is bottle feeding the baby, and was shown side lying position.  Follow up with this mom after pumping - she expressed at least 50 mls of transitional milk, Her left breast is much softer, but her right is still firm. Mom was advised to pump at home every 2-3 hours, and to attempt to latch the baby as often as she wants to, but to focus today on softening her breasts, getting her milk flowing, and feedng the baby - even if this means bottle feeding EBm for today. Mom has an appointment with Lactation at her ped sofice. Mom knows to call for questions/concerns, as needed.    Maternal Data    Feeding Feeding Type: Bottle Fed - Breast Milk Nipple Type: Slow - flow  LATCH Score/Interventions       Type of Nipple: Everted at rest and after stimulation (right also semi inverted)  Comfort (Breast/Nipple): Filling, red/small blisters or bruises, mild/mod discomfort Problem noted: Engorgment Intervention(s): Ice;Hand  expression  Problem noted: Mild/Moderate discomfort Interventions (Mild/moderate discomfort): Comfort gels  Hold (Positioning): Assistance needed to correctly position infant at breast and maintain latch.     Lactation Tools Discussed/Used Tools: Pump;Nipple Shields Nipple shield size: 24 Breast pump type: Double-Electric Breast Pump   Consult Status Consult Status: Follow-up Date: 10/02/14 Follow-up type: In-patient    [name], [name] 10/02/2014, 8:59 AM

## 2014-10-02 NOTE — Discharge Instructions (Signed)

## 2015-03-26 ENCOUNTER — Other Ambulatory Visit: Payer: Self-pay | Admitting: Obstetrics and Gynecology

## 2015-03-26 ENCOUNTER — Other Ambulatory Visit (HOSPITAL_COMMUNITY)
Admission: RE | Admit: 2015-03-26 | Discharge: 2015-03-26 | Disposition: A | Discharge status: Home / Self Care | Payer: BLUE CROSS/BLUE SHIELD | Source: Ambulatory Visit | Attending: Obstetrics and Gynecology | Admitting: Obstetrics and Gynecology

## 2015-03-26 DIAGNOSIS — Z01419 Encounter for gynecological examination (general) (routine) without abnormal findings: Secondary | ICD-10-CM | POA: Insufficient documentation

## 2015-03-30 LAB — CYTOLOGY - PAP

## 2016-05-02 ENCOUNTER — Other Ambulatory Visit (HOSPITAL_COMMUNITY)
Admission: RE | Admit: 2016-05-02 | Discharge: 2016-05-02 | Disposition: A | Discharge status: Home / Self Care | Payer: 59 | Source: Ambulatory Visit | Attending: Obstetrics and Gynecology | Admitting: Obstetrics and Gynecology

## 2016-05-02 ENCOUNTER — Other Ambulatory Visit: Payer: Self-pay | Admitting: Obstetrics and Gynecology

## 2016-05-02 DIAGNOSIS — Z01419 Encounter for gynecological examination (general) (routine) without abnormal findings: Secondary | ICD-10-CM | POA: Diagnosis not present

## 2016-05-03 LAB — CYTOLOGY - PAP: DIAGNOSIS: NEGATIVE

## 2017-01-24 LAB — OB RESULTS CONSOLE HEPATITIS B SURFACE ANTIGEN: HEP B S AG: NEGATIVE

## 2017-01-24 LAB — OB RESULTS CONSOLE ABO/RH: RH TYPE: POSITIVE

## 2017-01-24 LAB — OB RESULTS CONSOLE HIV ANTIBODY (ROUTINE TESTING): HIV: NONREACTIVE

## 2017-01-24 LAB — OB RESULTS CONSOLE ANTIBODY SCREEN: Antibody Screen: NEGATIVE

## 2017-01-24 LAB — OB RESULTS CONSOLE RPR: RPR: NONREACTIVE

## 2017-01-24 LAB — OB RESULTS CONSOLE GC/CHLAMYDIA
Chlamydia: NEGATIVE
Gonorrhea: NEGATIVE

## 2017-01-24 LAB — OB RESULTS CONSOLE RUBELLA ANTIBODY, IGM: RUBELLA: IMMUNE

## 2017-04-16 DIAGNOSIS — O09522 Supervision of elderly multigravida, second trimester: Secondary | ICD-10-CM | POA: Diagnosis not present

## 2017-04-16 DIAGNOSIS — Z3A18 18 weeks gestation of pregnancy: Secondary | ICD-10-CM | POA: Diagnosis not present

## 2017-05-04 ENCOUNTER — Other Ambulatory Visit: Payer: Self-pay | Admitting: Obstetrics and Gynecology

## 2017-05-04 DIAGNOSIS — R1084 Generalized abdominal pain: Secondary | ICD-10-CM

## 2017-05-07 ENCOUNTER — Ambulatory Visit (HOSPITAL_COMMUNITY)
Admission: RE | Admit: 2017-05-07 | Discharge: 2017-05-07 | Disposition: A | Discharge status: Home / Self Care | Payer: 59 | Source: Ambulatory Visit | Attending: Obstetrics and Gynecology | Admitting: Obstetrics and Gynecology

## 2017-05-07 DIAGNOSIS — R1011 Right upper quadrant pain: Secondary | ICD-10-CM | POA: Insufficient documentation

## 2017-05-07 DIAGNOSIS — R1084 Generalized abdominal pain: Secondary | ICD-10-CM

## 2017-05-07 DIAGNOSIS — O26892 Other specified pregnancy related conditions, second trimester: Secondary | ICD-10-CM | POA: Insufficient documentation

## 2017-05-07 DIAGNOSIS — Z3A21 21 weeks gestation of pregnancy: Secondary | ICD-10-CM | POA: Diagnosis not present

## 2017-05-28 DIAGNOSIS — L299 Pruritus, unspecified: Secondary | ICD-10-CM | POA: Diagnosis not present

## 2017-05-30 ENCOUNTER — Other Ambulatory Visit: Payer: Self-pay

## 2017-05-30 ENCOUNTER — Encounter (HOSPITAL_COMMUNITY): Payer: Self-pay

## 2017-05-30 ENCOUNTER — Inpatient Hospital Stay (HOSPITAL_COMMUNITY)
Admission: AD | Admit: 2017-05-30 | Discharge: 2017-05-31 | Disposition: A | Discharge status: Home / Self Care | Payer: 59 | Source: Ambulatory Visit | Attending: Obstetrics and Gynecology | Admitting: Obstetrics and Gynecology

## 2017-05-30 DIAGNOSIS — R1013 Epigastric pain: Secondary | ICD-10-CM

## 2017-05-30 DIAGNOSIS — Z882 Allergy status to sulfonamides status: Secondary | ICD-10-CM | POA: Insufficient documentation

## 2017-05-30 DIAGNOSIS — K219 Gastro-esophageal reflux disease without esophagitis: Secondary | ICD-10-CM | POA: Diagnosis not present

## 2017-05-30 DIAGNOSIS — Z3A25 25 weeks gestation of pregnancy: Secondary | ICD-10-CM | POA: Diagnosis not present

## 2017-05-30 DIAGNOSIS — O99612 Diseases of the digestive system complicating pregnancy, second trimester: Secondary | ICD-10-CM | POA: Insufficient documentation

## 2017-05-30 DIAGNOSIS — R1010 Upper abdominal pain, unspecified: Secondary | ICD-10-CM | POA: Diagnosis present

## 2017-05-30 DIAGNOSIS — Z880 Allergy status to penicillin: Secondary | ICD-10-CM | POA: Diagnosis not present

## 2017-05-30 LAB — URINALYSIS, ROUTINE W REFLEX MICROSCOPIC
Bilirubin Urine: NEGATIVE
GLUCOSE, UA: NEGATIVE mg/dL
Hgb urine dipstick: NEGATIVE
KETONES UR: NEGATIVE mg/dL
Leukocytes, UA: NEGATIVE
NITRITE: NEGATIVE
PH: 7 (ref 5.0–8.0)
PROTEIN: NEGATIVE mg/dL
Specific Gravity, Urine: 1.005 (ref 1.005–1.030)

## 2017-05-30 MED ORDER — GI COCKTAIL ~~LOC~~
30.0000 mL | Freq: Once | ORAL | Status: AC
  Administered 2017-05-30: 30 mL via ORAL
  Filled 2017-05-30: qty 30

## 2017-05-30 NOTE — MAU Note (Signed)
Pt presents to MAU with c/o intermittent upper abdominal pain for 1 week. Pt denies VB and LOF. +FM

## 2017-05-31 MED ORDER — PANTOPRAZOLE SODIUM 40 MG PO TBEC
40.0000 mg | DELAYED_RELEASE_TABLET | Freq: Once | ORAL | Status: AC
  Administered 2017-05-31: 40 mg via ORAL
  Filled 2017-05-31: qty 1

## 2017-05-31 MED ORDER — FAMOTIDINE 20 MG PO TABS: 20.0000 mg | ORAL_TABLET | Freq: Two times a day (BID) | ORAL | 0 refills | Status: AC

## 2017-05-31 NOTE — MAU Provider Note (Signed)
History     CSN: [id number]  Arrival date and time: 05/30/17 2123   None     Chief Complaint  Patient presents with  . Abdominal Pain   37 y/o G2 P1001 @ 25+weeks presents with c/o upper abdominal pain. Worsened today.  Unrelieved with OTC medications. Denies n/v, headache or visual changes. Pregnancy has been uncomplicated      Past Medical History:  Diagnosis Date  . Dysrhythmia    occ skipped beat  . History of IBS   . Hx of abnormal cervical Pap smear   . Hx of endometriosis   . Vertigo 2014   x 6 mos    Past Surgical History:  Procedure Laterality Date  . CESAREAN SECTION N/A 09/29/2014   Procedure: CESAREAN SECTION;  Surgeon: Gerald Leitz, MD;  Location: [facility];  Service: Obstetrics;  Laterality: N/A;  . DIAGNOSTIC LAPAROSCOPY    . DILATION AND EVACUATION  04/17/2011   Procedure: DILATATION AND EVACUATION;  Surgeon: Dorien Chihuahua. Richardson Dopp, MD;  Location: [facility];  Service: Gynecology;  Laterality: N/A;  with ultrasound guidance    History reviewed. No pertinent family history.  Social History   Tobacco Use  . Smoking status: Never Smoker  . Smokeless tobacco: Never Used  Substance Use Topics  . Alcohol use: No  . Drug use: No    Allergies:  Allergies  Allergen Reactions  . Penicillins Rash    Childhood reaction  . Sulfa Antibiotics Rash    Childhood reaction    Medications Prior to Admission  Medication Sig Dispense Refill Last Dose  . Cholecalciferol (VITAMIN D3) 5000 UNITS TABS Take 1 tablet by mouth daily.     Marland Kitchen docusate sodium (COLACE) 100 MG capsule Take 200 mg by mouth daily as needed for mild constipation.     Marland Kitchen ibuprofen (ADVIL,MOTRIN) 600 MG tablet Take 1 tablet (600 mg total) by mouth every 6 (six) hours. 30 tablet 0   . omega-3 acid ethyl esters (LOVAZA) 1 G capsule Take 2 g by mouth daily.     Marland Kitchen OVER THE COUNTER MEDICATION Take 10 mLs by mouth daily. Powdered Magnesium     . oxyCODONE-acetaminophen (PERCOCET/ROXICET) 5-325 MG per tablet 1-2  tablets every 4-6 hours prn pain. 30 tablet 0   . Prenatal Vit-Fe Fumarate-FA (PRENATAL MULTIVITAMIN) TABS Take 1 tablet by mouth daily.   04/16/2011 at Unknown  . Probiotic Product (PROBIOTIC PO) Take 1 capsule by mouth daily.     Marland Kitchen triamcinolone cream (KENALOG) 0.5 % Apply 1 application topically daily as needed (rash on hands).     . vitamin C (ASCORBIC ACID) 500 MG tablet Take 500 mg by mouth daily.       Review of Systems  Eyes: Negative.   Neurological: Negative for headaches.   Physical Exam   Blood pressure 117/62, pulse 79, temperature 98.6 F (37 C), temperature source Oral, resp. rate 18, unknown if currently breastfeeding.  Physical Exam  Constitutional: She is oriented to person, place, and time. She appears well-developed and well-nourished.  HENT:  Head: Normocephalic and atraumatic.  Eyes: EOM are normal.  Neck: Normal range of motion.  Respiratory: Effort normal. No respiratory distress.  GI: There is tenderness.  Mile epigastric tenderness.  Musculoskeletal: Normal range of motion.  Neurological: She is alert and oriented to person, place, and time.  Skin: Skin is warm and dry.    MAU Course  Procedures  Symptoms improved with GI cocktail.    Assessment and Plan  IUP @  25+ weeks Epigastric pain due to GERD. PO Protonix now and discharge with rx. Resume routine prenatal care.  Geryl Rankinsvelyn Jovane Foutz 05/31/2017, 12:59 AM

## 2017-05-31 NOTE — Discharge Instructions (Signed)

## 2017-08-13 DIAGNOSIS — O4403 Placenta previa specified as without hemorrhage, third trimester: Secondary | ICD-10-CM | POA: Diagnosis not present

## 2017-08-13 DIAGNOSIS — O09523 Supervision of elderly multigravida, third trimester: Secondary | ICD-10-CM | POA: Diagnosis not present

## 2017-08-20 ENCOUNTER — Encounter (HOSPITAL_COMMUNITY): Payer: Self-pay

## 2017-08-31 ENCOUNTER — Encounter (HOSPITAL_COMMUNITY): Payer: Self-pay | Admitting: *Deleted

## 2017-08-31 ENCOUNTER — Inpatient Hospital Stay (HOSPITAL_COMMUNITY)
Admission: AD | Admit: 2017-08-31 | Discharge: 2017-09-04 | DRG: 787 | Disposition: A | Discharge status: Home / Self Care | Payer: 59 | Attending: Obstetrics and Gynecology | Admitting: Obstetrics and Gynecology

## 2017-08-31 DIAGNOSIS — K5900 Constipation, unspecified: Secondary | ICD-10-CM | POA: Diagnosis present

## 2017-08-31 DIAGNOSIS — O34211 Maternal care for low transverse scar from previous cesarean delivery: Principal | ICD-10-CM | POA: Diagnosis present

## 2017-08-31 DIAGNOSIS — O321XX0 Maternal care for breech presentation, not applicable or unspecified: Secondary | ICD-10-CM | POA: Diagnosis present

## 2017-08-31 DIAGNOSIS — Z3A38 38 weeks gestation of pregnancy: Secondary | ICD-10-CM

## 2017-08-31 DIAGNOSIS — K589 Irritable bowel syndrome without diarrhea: Secondary | ICD-10-CM | POA: Diagnosis present

## 2017-08-31 DIAGNOSIS — Z98891 History of uterine scar from previous surgery: Secondary | ICD-10-CM

## 2017-08-31 DIAGNOSIS — Z880 Allergy status to penicillin: Secondary | ICD-10-CM

## 2017-08-31 DIAGNOSIS — O9962 Diseases of the digestive system complicating childbirth: Secondary | ICD-10-CM | POA: Diagnosis present

## 2017-08-31 DIAGNOSIS — O4443 Low lying placenta NOS or without hemorrhage, third trimester: Secondary | ICD-10-CM | POA: Diagnosis present

## 2017-08-31 DIAGNOSIS — O9089 Other complications of the puerperium, not elsewhere classified: Secondary | ICD-10-CM | POA: Diagnosis present

## 2017-08-31 MED ORDER — LACTATED RINGERS IV SOLN
INTRAVENOUS | Status: DC
Start: 2017-08-31 — End: 2017-09-04
  Administered 2017-08-31 – 2017-09-01 (×3): via INTRAVENOUS

## 2017-08-31 MED ORDER — PROMETHAZINE HCL 25 MG/ML IJ SOLN
12.5000 mg | Freq: Once | INTRAMUSCULAR | Status: AC
  Administered 2017-08-31: 12.5 mg via INTRAVENOUS
  Filled 2017-08-31: qty 1

## 2017-08-31 MED ORDER — MORPHINE SULFATE (PF) 4 MG/ML IV SOLN
2.0000 mg | Freq: Once | INTRAVENOUS | Status: AC
  Administered 2017-08-31: 2 mg via INTRAVENOUS
  Filled 2017-08-31: qty 1

## 2017-08-31 NOTE — MAU Note (Signed)
Ctxs since 1530. Has low lying placenta so prefers to have repeat c/s which is scheduled for 6/4. No bleeding or LOF

## 2017-08-31 NOTE — MAU Provider Note (Addendum)
History     CSN: [id number]  Arrival date and time: 08/31/17 2125   None     Chief Complaint  Patient presents with  . Contractions   37 y.o. G2P1 at 359w3d presents to MAU with moderate strength contractions since 1530. They are strong enough that she cannot speak while they are occurring. She denies bleeding or loss of fluid and has been feeling good fetal movement. Patient thinks she desires repeat cesarean but is not 100% sure yet.    OB History    Gravida  2   Para  1   Term  1   Preterm      AB  0   Living  1     SAB      TAB      Ectopic      Multiple  0   Live Births  1           Past Medical History:  Diagnosis Date  . Dysrhythmia    occ skipped beat  . History of IBS   . Hx of abnormal cervical Pap smear   . Hx of endometriosis   . Vaginal Pap smear, abnormal   . Vertigo 2014   x 6 mos    Past Surgical History:  Procedure Laterality Date  . CESAREAN SECTION N/A 09/29/2014   Procedure: CESAREAN SECTION;  Surgeon: Gerald Leitzara Cole, MD;  Location: [facility];  Service: Obstetrics;  Laterality: N/A;  . COLONOSCOPY    . DIAGNOSTIC LAPAROSCOPY    . DILATION AND EVACUATION  04/17/2011   Procedure: DILATATION AND EVACUATION;  Surgeon: Dorien Chihuahuaara J. Richardson Doppole, MD;  Location: [facility];  Service: Gynecology;  Laterality: N/A;  with ultrasound guidance    Family History  Problem Relation Age of Onset  . Hypertension Father     Social History   Tobacco Use  . Smoking status: Never Smoker  . Smokeless tobacco: Never Used  Substance Use Topics  . Alcohol use: No  . Drug use: No    Allergies:  Allergies  Allergen Reactions  . Penicillins Rash    Childhood reaction Has patient had a PCN reaction causing immediate rash, facial/tongue/throat swelling, SOB or lightheadedness with hypotension: unkn Has patient had a PCN reaction causing severe rash involving mucus membranes or skin necrosis: unkn Has patient had a PCN reaction that required hospitalization:  unkn Has patient had a PCN reaction occurring within the last 10 years: no If all of the above answers are "NO", then may proceed with Cephalosporin use.   . Sulfa Antibiotics Rash    Childhood reaction    Medications Prior to Admission  Medication Sig Dispense Refill Last Dose  . Cholecalciferol (VITAMIN D3) 5000 UNITS TABS Take 1 tablet by mouth daily.   08/20/2017 at Unknown time  . famotidine (PEPCID) 20 MG tablet Take 1 tablet (20 mg total) by mouth 2 (two) times daily. 30 tablet 0 08/20/2017 at Unknown time  . OVER THE COUNTER MEDICATION Take 10 mLs by mouth daily. Powdered Magnesium     . Prenatal Vit-Fe Fumarate-FA (PRENATAL MULTIVITAMIN) TABS Take 1 tablet by mouth daily.   08/20/2017 at Unknown time  . Probiotic Product (PROBIOTIC PO) Take 1 capsule by mouth daily.   08/20/2017 at Unknown time  . triamcinolone cream (KENALOG) 0.5 % Apply 1 application topically daily as needed (rash on hands).     . vitamin C (ASCORBIC ACID) 500 MG tablet Take 500 mg by mouth daily.   08/20/2017 at Unknown  time  . zinc gluconate 50 MG tablet Take 50 mg by mouth daily.       Review of Systems  Gastrointestinal: Positive for abdominal pain.  Genitourinary: Negative for vaginal bleeding.  All other systems reviewed and are negative.  Physical Exam   Vitals:   08/31/17 2143  BP: 116/72  Pulse: 87  Resp: 18  Temp: 98.4 F (36.9 C)  Weight: 68.5 kg (151 lb)  Height: 5' 3.5" (1.613 m)   Physical Exam  Nursing note and vitals reviewed. Constitutional: She is oriented to person, place, and time. She appears well-developed and well-nourished.  HENT:  Head: Normocephalic.  Eyes: Pupils are equal, round, and reactive to light.  Cardiovascular: Normal rate and regular rhythm.  Respiratory: Effort normal and breath sounds normal.  Genitourinary: Vagina normal and uterus normal.  Musculoskeletal: Normal range of motion.  Neurological: She is alert and oriented to person, place, and time.  Skin:  Skin is warm and dry.  Psychiatric: She has a normal mood and affect. Her behavior is normal. Judgment and thought content normal.   Cervix was 1/20%/-3/vtx  MAU Course  MDM Will monitor patient's contractions and FHTs. Patient to decide if she desires cesarean section or TOLAC. Recheck cervix to see if she has made cervical change in a few hours and if so, will move to OR or L&D patient choice.   Assessment and Plan  37 y.o. G2P1 at [redacted]w[redacted]d Category 1 FHTs Contractions every 1-6 minutes, irregular   Rachel Welch 08/31/2017, 10:16 PM   Addendum: Patient has continued to contract and has made cervical change, now 2/70/-3/vtx. Patient was contracting regularly every 1-2 minutes. Was given morphine and phenergan which spaced contractions to every 2-4 minutes. Patient verbalizes desire for repeat cesarean section. Plan repeat cesarean now, admit patient to pre-op.   Rachel Welch 09/01/2017 12:54 AM

## 2017-08-31 NOTE — Patient Instructions (Addendum)
Estoria Geary Burkemper  08/31/2017   Your procedure is scheduled on:  09/04/2017  Enter through the Main Entrance of [facility] at 1030 AM.  Pick up the phone at the desk and dial [phone]  Call this number if you have problems the morning of surgery:[phone]  Remember:   Do not eat food:(After Midnight) Desps de medianoche.  Do not drink clear liquids: (After Midnight) Desps de medianoche.  Take these medicines the morning of surgery with A SIP OF WATER: none   Do not wear jewelry, make-up or nail polish.  Do not wear lotions, powders, or perfumes. Do not wear deodorant.  Do not shave 48 hours prior to surgery.  Do not bring valuables to the hospital.  [facility] is not   responsible for any belongings or valuables brought to the hospital.  Contacts, dentures or bridgework may not be worn into surgery.  Leave suitcase in the car. After surgery it may be brought to your room.  For patients admitted to the hospital, checkout time is 11:00 AM the day of              discharge.    N/A   Please read over the following fact sheets that you were given:   Surgical Site Infection Prevention

## 2017-09-01 ENCOUNTER — Inpatient Hospital Stay (HOSPITAL_COMMUNITY): Payer: 59 | Admitting: Anesthesiology

## 2017-09-01 ENCOUNTER — Encounter (HOSPITAL_COMMUNITY)
Admission: AD | Disposition: A | Discharge status: Home / Self Care | Payer: Self-pay | Source: Home / Self Care | Attending: Obstetrics & Gynecology

## 2017-09-01 ENCOUNTER — Encounter (HOSPITAL_COMMUNITY): Payer: Self-pay | Admitting: Anesthesiology

## 2017-09-01 DIAGNOSIS — O4443 Low lying placenta NOS or without hemorrhage, third trimester: Secondary | ICD-10-CM | POA: Diagnosis not present

## 2017-09-01 DIAGNOSIS — Z3A38 38 weeks gestation of pregnancy: Secondary | ICD-10-CM | POA: Diagnosis not present

## 2017-09-01 DIAGNOSIS — O34211 Maternal care for low transverse scar from previous cesarean delivery: Secondary | ICD-10-CM | POA: Diagnosis not present

## 2017-09-01 DIAGNOSIS — Z98891 History of uterine scar from previous surgery: Secondary | ICD-10-CM

## 2017-09-01 DIAGNOSIS — K589 Irritable bowel syndrome without diarrhea: Secondary | ICD-10-CM | POA: Diagnosis present

## 2017-09-01 DIAGNOSIS — K5900 Constipation, unspecified: Secondary | ICD-10-CM | POA: Diagnosis present

## 2017-09-01 DIAGNOSIS — Z880 Allergy status to penicillin: Secondary | ICD-10-CM | POA: Diagnosis not present

## 2017-09-01 DIAGNOSIS — O321XX0 Maternal care for breech presentation, not applicable or unspecified: Secondary | ICD-10-CM | POA: Diagnosis not present

## 2017-09-01 DIAGNOSIS — O9962 Diseases of the digestive system complicating childbirth: Secondary | ICD-10-CM | POA: Diagnosis present

## 2017-09-01 DIAGNOSIS — O9089 Other complications of the puerperium, not elsewhere classified: Secondary | ICD-10-CM | POA: Diagnosis not present

## 2017-09-01 LAB — TYPE AND SCREEN
ABO/RH(D): O POS
Antibody Screen: NEGATIVE

## 2017-09-01 LAB — CBC
HCT: 37.3 % (ref 36.0–46.0)
HEMATOCRIT: 34.1 % — AB (ref 36.0–46.0)
HEMOGLOBIN: 12.5 g/dL (ref 12.0–15.0)
HEMOGLOBIN: 11.6 g/dL — AB (ref 12.0–15.0)
MCH: 32.5 pg (ref 26.0–34.0)
MCH: 33.1 pg (ref 26.0–34.0)
MCHC: 33.5 g/dL (ref 30.0–36.0)
MCHC: 34 g/dL (ref 30.0–36.0)
MCV: 96.9 fL (ref 78.0–100.0)
MCV: 97.4 fL (ref 78.0–100.0)
PLATELETS: 108 10*3/uL — AB (ref 150–400)
Platelets: 109 10*3/uL — ABNORMAL LOW (ref 150–400)
RBC: 3.85 MIL/uL — ABNORMAL LOW (ref 3.87–5.11)
RBC: 3.5 MIL/uL — AB (ref 3.87–5.11)
RDW: 13.9 % (ref 11.5–15.5)
RDW: 13.8 % (ref 11.5–15.5)
WBC: 11.9 10*3/uL — ABNORMAL HIGH (ref 4.0–10.5)
WBC: 16.5 10*3/uL — AB (ref 4.0–10.5)

## 2017-09-01 LAB — RPR: RPR: NONREACTIVE

## 2017-09-01 SURGERY — Surgical Case
Anesthesia: Spinal

## 2017-09-01 MED ORDER — MENTHOL 3 MG MT LOZG
1.0000 | LOZENGE | OROMUCOSAL | Status: DC | PRN
  Administered 2017-09-03: 3 mg via ORAL
  Filled 2017-09-01: qty 9

## 2017-09-01 MED ORDER — IBUPROFEN 600 MG PO TABS
600.0000 mg | ORAL_TABLET | Freq: Four times a day (QID) | ORAL | Status: DC
  Administered 2017-09-01 – 2017-09-04 (×12): 600 mg via ORAL
  Filled 2017-09-01 (×12): qty 1

## 2017-09-01 MED ORDER — BUPIVACAINE IN DEXTROSE 0.75-8.25 % IT SOLN
INTRATHECAL | Status: DC | PRN
  Administered 2017-09-01: 1.5 mL via INTRATHECAL

## 2017-09-01 MED ORDER — ZOLPIDEM TARTRATE 5 MG PO TABS: 5.0000 mg | ORAL_TABLET | Freq: Every evening | ORAL | Status: DC | PRN

## 2017-09-01 MED ORDER — HYDROMORPHONE HCL 1 MG/ML IJ SOLN: 0.2500 mg | INTRAMUSCULAR | Status: DC | PRN

## 2017-09-01 MED ORDER — LACTATED RINGERS IV SOLN
INTRAVENOUS | Status: DC
  Administered 2017-09-01: 03:00:00 via INTRAVENOUS

## 2017-09-01 MED ORDER — CLINDAMYCIN PHOSPHATE 900 MG/50ML IV SOLN: 900.0000 mg | INTRAVENOUS | Status: DC

## 2017-09-01 MED ORDER — ACETAMINOPHEN 325 MG PO TABS
650.0000 mg | ORAL_TABLET | ORAL | Status: DC | PRN
  Administered 2017-09-01 – 2017-09-02 (×3): 650 mg via ORAL
  Filled 2017-09-01 (×2): qty 2

## 2017-09-01 MED ORDER — ONDANSETRON HCL 4 MG/2ML IJ SOLN: 4.0000 mg | Freq: Three times a day (TID) | INTRAMUSCULAR | Status: DC | PRN

## 2017-09-01 MED ORDER — SODIUM CHLORIDE 0.9 % IR SOLN
Status: DC | PRN
  Administered 2017-09-01: 1000 mL

## 2017-09-01 MED ORDER — GENTAMICIN SULFATE 40 MG/ML IJ SOLN: 5.0000 mg/kg | INTRAVENOUS | Status: DC

## 2017-09-01 MED ORDER — FENTANYL CITRATE (PF) 100 MCG/2ML IJ SOLN
INTRAMUSCULAR | Status: AC
  Filled 2017-09-01: qty 2

## 2017-09-01 MED ORDER — LACTATED RINGERS IV BOLUS: 1000.0000 mL | Freq: Once | INTRAVENOUS | Status: DC

## 2017-09-01 MED ORDER — SIMETHICONE 80 MG PO CHEW
80.0000 mg | CHEWABLE_TABLET | Freq: Three times a day (TID) | ORAL | Status: DC
  Administered 2017-09-01 – 2017-09-04 (×9): 80 mg via ORAL
  Filled 2017-09-01 (×9): qty 1

## 2017-09-01 MED ORDER — COCONUT OIL OIL: 1.0000 "application " | TOPICAL_OIL | Status: DC | PRN

## 2017-09-01 MED ORDER — TETANUS-DIPHTH-ACELL PERTUSSIS 5-2.5-18.5 LF-MCG/0.5 IM SUSP
0.5000 mL | Freq: Once | INTRAMUSCULAR | Status: AC
  Administered 2017-09-03: 0.5 mL via INTRAMUSCULAR
  Filled 2017-09-01: qty 0.5

## 2017-09-01 MED ORDER — PHENYLEPHRINE 8 MG IN D5W 100 ML (0.08MG/ML) PREMIX OPTIME
INJECTION | INTRAVENOUS | Status: DC | PRN
  Administered 2017-09-01: 60 ug/min via INTRAVENOUS

## 2017-09-01 MED ORDER — SOD CITRATE-CITRIC ACID 500-334 MG/5ML PO SOLN
30.0000 mL | Freq: Once | ORAL | Status: AC
  Administered 2017-09-01: 30 mL via ORAL
  Filled 2017-09-01: qty 15

## 2017-09-01 MED ORDER — SODIUM CHLORIDE 0.9% FLUSH: 3.0000 mL | INTRAVENOUS | Status: DC | PRN

## 2017-09-01 MED ORDER — DIPHENHYDRAMINE HCL 25 MG PO CAPS: 25.0000 mg | ORAL_CAPSULE | Freq: Four times a day (QID) | ORAL | Status: DC | PRN

## 2017-09-01 MED ORDER — SOD CITRATE-CITRIC ACID 500-334 MG/5ML PO SOLN: 30.0000 mL | Freq: Once | ORAL | Status: DC

## 2017-09-01 MED ORDER — DIPHENHYDRAMINE HCL 50 MG/ML IJ SOLN: 12.5000 mg | INTRAMUSCULAR | Status: DC | PRN

## 2017-09-01 MED ORDER — NALBUPHINE HCL 10 MG/ML IJ SOLN: 5.0000 mg | Freq: Once | INTRAMUSCULAR | Status: DC | PRN

## 2017-09-01 MED ORDER — WITCH HAZEL-GLYCERIN EX PADS: 1.0000 "application " | MEDICATED_PAD | CUTANEOUS | Status: DC | PRN

## 2017-09-01 MED ORDER — MEPERIDINE HCL 25 MG/ML IJ SOLN: 6.2500 mg | INTRAMUSCULAR | Status: DC | PRN

## 2017-09-01 MED ORDER — MORPHINE SULFATE (PF) 0.5 MG/ML IJ SOLN
INTRAMUSCULAR | Status: AC
  Filled 2017-09-01: qty 10

## 2017-09-01 MED ORDER — SCOPOLAMINE 1 MG/3DAYS TD PT72
MEDICATED_PATCH | TRANSDERMAL | Status: DC | PRN
  Administered 2017-09-01: 1 via TRANSDERMAL

## 2017-09-01 MED ORDER — NALBUPHINE HCL 10 MG/ML IJ SOLN: 5.0000 mg | INTRAMUSCULAR | Status: DC | PRN

## 2017-09-01 MED ORDER — GENTAMICIN SULFATE 40 MG/ML IJ SOLN
5.0000 mg/kg | Freq: Once | INTRAMUSCULAR | Status: AC
  Administered 2017-09-01: 342.4 mg via INTRAVENOUS
  Filled 2017-09-01: qty 8.5

## 2017-09-01 MED ORDER — SCOPOLAMINE 1 MG/3DAYS TD PT72: 1.0000 | MEDICATED_PATCH | Freq: Once | TRANSDERMAL | Status: DC

## 2017-09-01 MED ORDER — PRENATAL MULTIVITAMIN CH
1.0000 | ORAL_TABLET | Freq: Every day | ORAL | Status: DC
  Administered 2017-09-02 – 2017-09-04 (×3): 1 via ORAL
  Filled 2017-09-01 (×3): qty 1

## 2017-09-01 MED ORDER — SIMETHICONE 80 MG PO CHEW: 80.0000 mg | CHEWABLE_TABLET | ORAL | Status: DC | PRN

## 2017-09-01 MED ORDER — FENTANYL CITRATE (PF) 100 MCG/2ML IJ SOLN
INTRAMUSCULAR | Status: DC | PRN
  Administered 2017-09-01: 100 ug via INTRAVENOUS

## 2017-09-01 MED ORDER — NALOXONE HCL 4 MG/10ML IJ SOLN
1.0000 ug/kg/h | INTRAVENOUS | Status: DC | PRN
  Filled 2017-09-01: qty 5

## 2017-09-01 MED ORDER — ONDANSETRON HCL 4 MG/2ML IJ SOLN
INTRAMUSCULAR | Status: DC | PRN
  Administered 2017-09-01: 4 mg via INTRAVENOUS

## 2017-09-01 MED ORDER — OXYCODONE-ACETAMINOPHEN 5-325 MG PO TABS: 2.0000 | ORAL_TABLET | ORAL | Status: DC | PRN

## 2017-09-01 MED ORDER — SENNOSIDES-DOCUSATE SODIUM 8.6-50 MG PO TABS
2.0000 | ORAL_TABLET | ORAL | Status: DC
  Administered 2017-09-02 – 2017-09-03 (×3): 2 via ORAL
  Filled 2017-09-01 (×3): qty 2

## 2017-09-01 MED ORDER — SIMETHICONE 80 MG PO CHEW
80.0000 mg | CHEWABLE_TABLET | ORAL | Status: DC
  Administered 2017-09-02 – 2017-09-03 (×3): 80 mg via ORAL
  Filled 2017-09-01 (×3): qty 1

## 2017-09-01 MED ORDER — OXYCODONE-ACETAMINOPHEN 5-325 MG PO TABS
1.0000 | ORAL_TABLET | ORAL | Status: DC | PRN
  Administered 2017-09-02 – 2017-09-04 (×9): 1 via ORAL
  Filled 2017-09-01 (×9): qty 1

## 2017-09-01 MED ORDER — DEXAMETHASONE SODIUM PHOSPHATE 10 MG/ML IJ SOLN
INTRAMUSCULAR | Status: DC | PRN
  Administered 2017-09-01: 10 mg via INTRAVENOUS

## 2017-09-01 MED ORDER — FAMOTIDINE IN NACL 20-0.9 MG/50ML-% IV SOLN
20.0000 mg | Freq: Once | INTRAVENOUS | Status: AC
  Administered 2017-09-01: 20 mg via INTRAVENOUS
  Filled 2017-09-01: qty 50

## 2017-09-01 MED ORDER — PHENYLEPHRINE 8 MG IN D5W 100 ML (0.08MG/ML) PREMIX OPTIME
INJECTION | INTRAVENOUS | Status: AC
  Filled 2017-09-01: qty 100

## 2017-09-01 MED ORDER — NALOXONE HCL 0.4 MG/ML IJ SOLN: 0.4000 mg | INTRAMUSCULAR | Status: DC | PRN

## 2017-09-01 MED ORDER — SCOPOLAMINE 1 MG/3DAYS TD PT72
MEDICATED_PATCH | TRANSDERMAL | Status: AC
  Filled 2017-09-01: qty 1

## 2017-09-01 MED ORDER — BUPIVACAINE HCL (PF) 0.5 % IJ SOLN
INTRAMUSCULAR | Status: AC
  Filled 2017-09-01: qty 30

## 2017-09-01 MED ORDER — DIBUCAINE 1 % RE OINT: 1.0000 "application " | TOPICAL_OINTMENT | RECTAL | Status: DC | PRN

## 2017-09-01 MED ORDER — KETOROLAC TROMETHAMINE 30 MG/ML IJ SOLN
INTRAMUSCULAR | Status: AC
  Filled 2017-09-01: qty 1

## 2017-09-01 MED ORDER — DIPHENHYDRAMINE HCL 25 MG PO CAPS
25.0000 mg | ORAL_CAPSULE | ORAL | Status: DC | PRN
  Filled 2017-09-01: qty 1

## 2017-09-01 MED ORDER — LACTATED RINGERS IV SOLN
INTRAVENOUS | Status: DC
  Administered 2017-09-01: 17:00:00 via INTRAVENOUS

## 2017-09-01 MED ORDER — PROMETHAZINE HCL 25 MG/ML IJ SOLN: 6.2500 mg | INTRAMUSCULAR | Status: DC | PRN

## 2017-09-01 MED ORDER — MORPHINE SULFATE (PF) 0.5 MG/ML IJ SOLN
INTRAMUSCULAR | Status: DC | PRN
  Administered 2017-09-01: .2 mg via EPIDURAL

## 2017-09-01 MED ORDER — KETOROLAC TROMETHAMINE 30 MG/ML IJ SOLN: 30.0000 mg | Freq: Once | INTRAMUSCULAR | Status: DC | PRN

## 2017-09-01 MED ORDER — OXYTOCIN 40 UNITS IN LACTATED RINGERS INFUSION - SIMPLE MED: 2.5000 [IU]/h | INTRAVENOUS | Status: AC

## 2017-09-01 MED ORDER — BUPIVACAINE HCL 0.5 % IJ SOLN
INTRAMUSCULAR | Status: DC | PRN
  Administered 2017-09-01: 30 mL

## 2017-09-01 SURGICAL SUPPLY — 47 items
ADH SKN CLS APL DERMABOND .7 (GAUZE/BANDAGES/DRESSINGS) ×1
APL SKNCLS STERI-STRIP NONHPOA (GAUZE/BANDAGES/DRESSINGS) ×1
BARRIER ADHS 3X4 INTERCEED (GAUZE/BANDAGES/DRESSINGS) IMPLANT
BENZOIN TINCTURE PRP APPL 2/3 (GAUZE/BANDAGES/DRESSINGS) ×3 IMPLANT
BRR ADH 4X3 ABS CNTRL BYND (GAUZE/BANDAGES/DRESSINGS)
CHLORAPREP W/TINT 26ML (MISCELLANEOUS) ×3 IMPLANT
CLAMP CORD UMBIL (MISCELLANEOUS) IMPLANT
CLOSURE STERI-STRIP 1/2X4 (GAUZE/BANDAGES/DRESSINGS) ×1
CLOSURE WOUND 1/2 X4 (GAUZE/BANDAGES/DRESSINGS) ×1
CLOTH BEACON ORANGE TIMEOUT ST (SAFETY) ×3 IMPLANT
CLSR STERI-STRIP ANTIMIC 1/2X4 (GAUZE/BANDAGES/DRESSINGS) ×1 IMPLANT
DERMABOND ADVANCED (GAUZE/BANDAGES/DRESSINGS) ×2
DERMABOND ADVANCED .7 DNX12 (GAUZE/BANDAGES/DRESSINGS) IMPLANT
DRSG OPSITE POSTOP 4X10 (GAUZE/BANDAGES/DRESSINGS) ×3 IMPLANT
ELECT REM PT RETURN 9FT ADLT (ELECTROSURGICAL) ×3
ELECTRODE REM PT RTRN 9FT ADLT (ELECTROSURGICAL) ×1 IMPLANT
EXTRACTOR VACUUM KIWI (MISCELLANEOUS) IMPLANT
GLOVE BIOGEL M 6.5 STRL (GLOVE) ×6 IMPLANT
GLOVE BIOGEL PI IND STRL 6.5 (GLOVE) ×1 IMPLANT
GLOVE BIOGEL PI IND STRL 7.0 (GLOVE) ×1 IMPLANT
GLOVE BIOGEL PI INDICATOR 6.5 (GLOVE) ×2
GLOVE BIOGEL PI INDICATOR 7.0 (GLOVE) ×2
GOWN STRL REUS W/TWL LRG LVL3 (GOWN DISPOSABLE) ×9 IMPLANT
KIT ABG SYR 3ML LUER SLIP (SYRINGE) IMPLANT
NDL HYPO 25X5/8 SAFETYGLIDE (NEEDLE) IMPLANT
NEEDLE HYPO 25X5/8 SAFETYGLIDE (NEEDLE) IMPLANT
NS IRRIG 1000ML POUR BTL (IV SOLUTION) ×3 IMPLANT
PACK C SECTION WH (CUSTOM PROCEDURE TRAY) ×3 IMPLANT
PAD ABD 7.5X8 STRL (GAUZE/BANDAGES/DRESSINGS) ×2 IMPLANT
PAD OB MATERNITY 4.3X12.25 (PERSONAL CARE ITEMS) ×3 IMPLANT
PENCIL SMOKE EVAC W/HOLSTER (ELECTROSURGICAL) ×3 IMPLANT
RTRCTR C-SECT PINK 25CM LRG (MISCELLANEOUS) IMPLANT
SPONGE GAUZE 4X4 12PLY STER LF (GAUZE/BANDAGES/DRESSINGS) ×4 IMPLANT
STRIP CLOSURE SKIN 1/2X4 (GAUZE/BANDAGES/DRESSINGS) ×2 IMPLANT
SUT PDS AB 0 CT1 27 (SUTURE) ×6 IMPLANT
SUT PDS AB 0 CTX 36 PDP370T (SUTURE) ×4 IMPLANT
SUT PLAIN 0 NONE (SUTURE) IMPLANT
SUT VIC AB 0 CTX 36 (SUTURE) ×9
SUT VIC AB 0 CTX36XBRD ANBCTRL (SUTURE) ×3 IMPLANT
SUT VIC AB 2-0 CT1 27 (SUTURE) ×3
SUT VIC AB 2-0 CT1 TAPERPNT 27 (SUTURE) ×1 IMPLANT
SUT VIC AB 3-0 SH 27 (SUTURE)
SUT VIC AB 3-0 SH 27X BRD (SUTURE) IMPLANT
SUT VIC AB 4-0 KS 27 (SUTURE) ×3 IMPLANT
TAPE CLOTH SURG 4X10 WHT LF (GAUZE/BANDAGES/DRESSINGS) ×2 IMPLANT
TOWEL OR 17X24 6PK STRL BLUE (TOWEL DISPOSABLE) ×3 IMPLANT
TRAY FOLEY W/BAG SLVR 14FR LF (SET/KITS/TRAYS/PACK) ×3 IMPLANT

## 2017-09-01 NOTE — Progress Notes (Signed)
This check was done at [facility]0745

## 2017-09-01 NOTE — Anesthesia Procedure Notes (Signed)
Spinal  Patient location during procedure: OR Staffing Anesthesiologist: Nolon Nations, MD Performed: anesthesiologist  Preanesthetic Checklist Completed: patient identified, site marked, surgical consent, pre-op evaluation, timeout performed, IV checked, risks and benefits discussed and monitors and equipment checked Spinal Block Patient position: sitting Prep: site prepped and draped Patient monitoring: heart rate, continuous pulse ox and blood pressure Approach: midline Location: L3-4 Injection technique: single-shot Needle Needle type: Sprotte  Needle gauge: 24 G Needle length: 9 cm Assessment Sensory level: T6 Additional Notes Expiration date of kit checked and confirmed. Patient tolerated procedure well, without complications.

## 2017-09-01 NOTE — Transfer of Care (Signed)
Immediate Anesthesia Transfer of Care Note  Patient: Rachel PerchesElizabeth A Twitty  Procedure(s) Performed: REPEAT CESAREAN SECTION (N/A )  Patient Location: PACU  Anesthesia Type:Spinal  Level of Consciousness: awake, alert  and oriented  Airway & Oxygen Therapy: Patient Spontanous Breathing  Post-op Assessment: Report given to RN and Post -op Vital signs reviewed and stable  Post vital signs: Reviewed and stable  Last Vitals:  Vitals Value Taken Time  BP 141/95 09/01/2017  3:57 AM  Temp    Pulse 89 09/01/2017  3:59 AM  Resp 13 09/01/2017  3:59 AM  SpO2 100 % 09/01/2017  3:59 AM  Vitals shown include unvalidated device data.  Last Pain:  Vitals:   08/31/17 2146  PainSc: 8       Patients Stated Pain Goal: 0 (08/31/17 2146)  Complications: No apparent anesthesia complications

## 2017-09-01 NOTE — H&P (Signed)
ISSIS Rachel Welch is a 37 y.o. female presenting for repeat cesarean section after onset of labor. OB History    Gravida  2   Para  1   Term  1   Preterm      AB  0   Living  1     SAB      TAB      Ectopic      Multiple  0   Live Births  1          Past Medical History:  Diagnosis Date  . Dysrhythmia    occ skipped beat  . History of IBS   . Hx of abnormal cervical Pap smear   . Hx of endometriosis   . Vaginal Pap smear, abnormal   . Vertigo 2014   x 6 mos   Past Surgical History:  Procedure Laterality Date  . CESAREAN SECTION N/A 09/29/2014   Procedure: CESAREAN SECTION;  Surgeon: Rachel Welch;  Location: [facility];  Service: Obstetrics;  Laterality: N/A;  . COLONOSCOPY    . DIAGNOSTIC LAPAROSCOPY    . DILATION AND EVACUATION  04/17/2011   Procedure: DILATATION AND EVACUATION;  Surgeon: Rachel Welch. Rachel Dopp, Welch;  Location: [facility];  Service: Gynecology;  Laterality: N/A;  with ultrasound guidance   Family History: family history includes Hypertension in her father. Social History:  reports that she has never smoked. She has never used smokeless tobacco. She reports that she does not drink alcohol or use drugs.     Maternal Diabetes: No Genetic Screening: Normal Maternal Ultrasounds/Referrals: Normal Fetal Ultrasounds or other Referrals:  Other: Low lying placenta, assessed at 36 weeks  Maternal Substance Abuse:  No Significant Maternal Medications:  None Significant Maternal Lab Results:  None Other Comments:  None  Review of Systems  Gastrointestinal: Positive for abdominal pain.  All other systems reviewed and are negative.  Maternal Medical History:  Reason for admission: Contractions.   Contractions: Onset was 6-12 hours ago.   Frequency: regular.   Duration is approximately 60 seconds.   Perceived severity is strong.    Fetal activity: Perceived fetal activity is normal.   Last perceived fetal movement was within the past hour.       Dilation: 2 Effacement (%): 30 Station: -3 Exam by:: Rachel Welch,Rachel Welch Blood pressure 116/72, pulse 87, temperature 98.4 F (36.9 C), resp. rate 18, height 5' 3.5" (1.613 m), weight 68.5 kg (151 lb), unknown if currently breastfeeding. Maternal Exam:  Uterine Assessment: Contraction strength is firm.  Contraction duration is 60 seconds. Contraction frequency is irregular.   Abdomen: Patient reports no abdominal tenderness. Surgical scars: low transverse.   Fundal height is Size = dates.   Estimated fetal weight is 8lbs.   Fetal presentation: vertex  Introitus: Normal vulva. Normal vagina.    Fetal Exam Fetal Monitor Review: Mode: ultrasound.   Baseline rate: 150.  Variability: moderate (6-25 bpm).   Pattern: accelerations present.    Fetal State Assessment: Category I - tracings are normal.     Physical Exam  Nursing note and vitals reviewed. Constitutional: She is oriented to person, place, and time. She appears well-developed and well-nourished.  HENT:  Head: Normocephalic and atraumatic.  Eyes: Pupils are equal, round, and reactive to light.  Cardiovascular: Normal rate and regular rhythm.  Respiratory: Effort normal and breath sounds normal.  Genitourinary: Vagina normal and uterus normal.  Musculoskeletal: Normal range of motion.  Neurological: She is alert and oriented to person, place, and time.  Skin: Skin is warm and dry.  Psychiatric: She has a normal mood and affect. Her behavior is normal. Judgment and thought content normal.    Prenatal labs: ABO, Rh: O/Positive/-- (10/24 0000) Antibody: Negative (10/24 0000) Rubella: Immune (10/24 0000) RPR: Nonreactive (10/24 0000)  HBsAg: Negative (10/24 0000)  HIV: Non-reactive (10/24 0000)  GBS:   Negative   Assessment/Plan: 37 y.o. G2P1 at 522w4d Early labor Category 1 FHTs Admit to PreOp Plan repeat cesarean section with Rachel Welch   Rachel Welch 09/01/2017, 12:55 AM

## 2017-09-01 NOTE — Anesthesia Preprocedure Evaluation (Signed)
Anesthesia Evaluation  Patient identified by MRN, date of birth, ID band Patient awake    Reviewed: Allergy & Precautions, H&P , NPO status , Patient's Chart, lab work & pertinent test results  Airway Mallampati: II  TM Distance: >3 FB Neck ROM: full    Dental no notable dental hx.    Pulmonary neg pulmonary ROS,    Pulmonary exam normal breath sounds clear to auscultation       Cardiovascular Exercise Tolerance: Good + dysrhythmias  Rhythm:regular Rate:Normal     Neuro/Psych negative neurological ROS  negative psych ROS   GI/Hepatic negative GI ROS, Neg liver ROS,   Endo/Other  negative endocrine ROS  Renal/GU negative Renal ROS  negative genitourinary   Musculoskeletal negative musculoskeletal ROS (+)   Abdominal   Peds negative pediatric ROS (+)  Hematology negative hematology ROS (+)   Anesthesia Other Findings Discussed low platelets and asked Dr Richardson Doppole if she wanted to wait till platelets were here. She wishes to proceed with CS  Reproductive/Obstetrics (+) Pregnancy                             Anesthesia Physical  Anesthesia Plan  ASA: II  Anesthesia Plan: Spinal   Post-op Pain Management:    Induction:   PONV Risk Score and Plan: 4 or greater and Ondansetron, Dexamethasone, Treatment may vary due to age or medical condition and Scopolamine patch - Pre-op  Airway Management Planned:   Additional Equipment:   Intra-op Plan:   Post-operative Plan:   Informed Consent: I have reviewed the patients History and Physical, chart, labs and discussed the procedure including the risks, benefits and alternatives for the proposed anesthesia with the patient or authorized representative who has indicated his/her understanding and acceptance.   Dental advisory given  Plan Discussed with: CRNA  Anesthesia Plan Comments:         Anesthesia Quick Evaluation

## 2017-09-01 NOTE — Progress Notes (Signed)
71ml of pitocin given while patient in PACU

## 2017-09-01 NOTE — Progress Notes (Signed)
This assessment was done at 0745, wrong time charted.

## 2017-09-01 NOTE — Lactation Note (Addendum)
This note was copied from a baby's chart. Lactation Consultation Note  Patient Name: Rachel Welch: 09/01/2017   P2, Baby 8 hours old.  Mother states she breastfed her 1st for 6 weeks.  She had difficulty latching was pumping and formula feeding and stopped at 6 weeks. Reviewed hand expression with good flow of colsostrum. Assisted w/ latching baby in football hold.  Intermittent sucks and swallows observed. Suggest mother to compress breast during feeding to keep baby active. Encouraged mother that each child is a different breastfeeding experience and so far this baby seems to be doing well w/ latching. Mom encouraged to feed baby 8-12 times/24 hours and with feeding cues.  Mom made aware of O/P services, breastfeeding support groups, community resources, and our phone # for post-discharge questions.       Maternal Data    Feeding Feeding Type: Breast Fed Length of feed: 5 min  LATCH Score Latch: Grasps breast easily, tongue down, lips flanged, rhythmical sucking.  Audible Swallowing: A few with stimulation  Type of Nipple: Everted at rest and after stimulation  Comfort (Breast/Nipple): Soft / non-tender  Hold (Positioning): Assistance needed to correctly position infant at breast and maintain latch.  LATCH Score: 8  Interventions    Lactation Tools Discussed/Used     Consult Status      Dahlia ByesBerkelhammer, [facility] 09/01/2017, 11:08 AM

## 2017-09-01 NOTE — Op Note (Signed)
Cesarean Section Procedure Note  Indications: 37 year old G2P1 at 38 weeks 4 days with previous uterine incision kerr x 1 in early  Active labor.   Pre-operative Diagnosis: 38 week 4 day pregnancy, prior Low transverse cesarean             Section  Post-operative Diagnosis: same plus Homero Fellers breech presentation  Surgeon: Wynonia Hazard   Assistants: None  Anesthesia: Spinal anesthesia  ASA Class: 2  Procedure Details   The patient was counseled about the risks, benefits, complications of the cesarean section. The patient concurred with the proposed plan, giving informed consent.  The site of surgery properly noted/marked. The patient was taken to Operating Room # 9, identified as Rachel Welch and the procedure verified as C-Section Delivery. A Time Out was held and the above information confirmed.  After spinal was found to adequate , the patient was placed in the dorsal supine position with a leftward tilt, draped and prepped in the usual sterile manner. A Pfannenstiel incision was made and carried down through the subcutaneous tissue to the fascia.  The fascia was incised in the midline and the fascial incision was extended laterally with Mayo scissors. The superior aspect of the fascial incision was grasped with Coker clamps x2, tented up and the rectus muscles dissected off sharply with the scalpel. The rectus was then dissected off with blunt dissection and Mayo scissors inferiorly. The rectus muscles were separated in the midline. The abdominal peritoneum was identified, tented up between two hemostats, entered sharply using Metzenbaum scissors, and the incision was extended superiorly and inferiorly with good visualization of the bladder. The Alexis retractor was then deployed. The vesicouterine peritoneum was identified, tented up, entered sharply with Metzenbaum scissors, and the bladder flap was created digitally. Scalpel was then used to make a low transverse incision on the  uterus which was extended laterally with  blunt dissection.  The fetal breech was identified, each leg was delivered  The body was delivered until the shoulders.  Each arm was delivered via Pinard maneuver. The head was delivered via Domenic Schwab, [place] maneuver. The A live female infant was bulb suctioned on the operative field cried vigorously, cord was clamped and cut and the infant was passed to the waiting neonatologist. Apgars 8/9. Placenta was then delivered spontaneously, intact and appear normal, the uterus was cleared of all clot and debris. The uterine incision was repaired with #0 Monocryl in running locked fashion. A second imbricating suture was performed using the same suture. The incision was hemostatic. Ovaries and tubes were inspected and normal. The Alexis retractor was removed. The abdominal cavity was cleared of all clot and debris. The abdominal peritoneum was reapproximated with 2-0 chromic  in a running fashion then incorporating the rectus muscles with the same suture.  The fascia was closed with 0 PDS in a running fashion The subcuticular layer was irrigated and all bleeders cauterized.    The incision was injected with 25 mL of 0.5% Marcaine.  The skin was closed with 4-0 vicryl in a subcuticular fashion using a Mellody Dance needle. The incision was dressed with benzoine, steri strips and pressure dressing. All sponge lap and needle counts were correct x3. Patient tolerated the procedure well and recovered in stable condition following the procedure.  Instrument, sponge, and needle counts were correct prior the abdominal closure and at the conclusion of the case.   Findings: Live female infant, Apgars 8/9, clear amniotic fluid, normal appearing placenta, normal uterus, bilateral  tubes and ovaries  Estimated Blood Loss: 504 mL  IVF: 2400 mL         Drains: Foley catheter  Urine output: 400 mL clear urine         Specimens: Placenta to L&D         Implants: none          Complications:  None; patient tolerated the procedure well.         Disposition: PACU - hemodynamically stable.   Rachel Welch Rachel Welch

## 2017-09-02 MED ORDER — FAMOTIDINE 20 MG PO TABS
10.0000 mg | ORAL_TABLET | Freq: Two times a day (BID) | ORAL | Status: DC
  Administered 2017-09-02 (×2): 10 mg via ORAL
  Filled 2017-09-02 (×2): qty 1

## 2017-09-02 MED ORDER — FAMOTIDINE 20 MG PO TABS
20.0000 mg | ORAL_TABLET | Freq: Two times a day (BID) | ORAL | Status: DC
  Administered 2017-09-02 – 2017-09-03 (×3): 20 mg via ORAL
  Filled 2017-09-02 (×3): qty 1

## 2017-09-02 MED ORDER — FAMOTIDINE 20 MG PO TABS
10.0000 mg | ORAL_TABLET | Freq: Two times a day (BID) | ORAL | Status: DC
  Filled 2017-09-02: qty 1

## 2017-09-02 NOTE — Progress Notes (Signed)
Subjective: Postpartum Day 1: Cesarean Delivery Patient reports tolerating PO, + flatus and no problems voiding.  Pt found resting in bed breastfeeding infant well. Pt has family at bedside. Pt endorses feel well, cramps and incision pain relieved by percocet and motrin. Pt ambulation well, tolerating PO meds, breastfeeding BG, and endorses wanting the minipill for contraception PP.   Objective: Vital signs in last 24 hours: Temp:  [98.4 F (36.9 C)-98.9 F (37.2 C)] 98.6 F (37 C) (06/02 0445) Pulse Rate:  [78-87] 83 (06/02 0445) Resp:  [16-18] 18 (06/02 0445) BP: (101-112)/(57-70) 112/70 (06/02 0445) SpO2:  [98 %-100 %] 100 % (06/02 0445)  Physical Exam:  General: alert, cooperative and appears stated age 65Lochia: appropriate Uterine Fundus: firm Incision: honeycomb dressing in tact, has old, brownish-red blood on 45% of the dressing, no new d/c.  DVT Evaluation: No evidence of DVT seen on physical exam. Negative Homan's sign. No cords or calf tenderness. No significant calf/ankle edema.  Recent Labs    09/01/17 0100 09/01/17 0610  HGB 12.5 11.6*  HCT 37.3 34.1*    Assessment/Plan: POD#1 from LTCS with honeycomb dressing intact.  Status post Cesarean section. Doing well postoperatively.  Continue current care. PP Contraception: Mini Pill at 6 week PPV Incision: RN to change dressing PRN and notify me if frank bleeding.   Amarri Satterly  NP-C 09/02/2017, 1:41 PM

## 2017-09-03 ENCOUNTER — Encounter (HOSPITAL_COMMUNITY): Payer: Self-pay | Admitting: *Deleted

## 2017-09-03 ENCOUNTER — Encounter (HOSPITAL_COMMUNITY)
Admission: RE | Admit: 2017-09-03 | Discharge: 2017-09-03 | Disposition: A | Discharge status: Home / Self Care | Payer: 59 | Source: Ambulatory Visit | Attending: Obstetrics and Gynecology | Admitting: Obstetrics and Gynecology

## 2017-09-03 HISTORY — DX: Unspecified abnormal cytological findings in specimens from vagina: R87.629

## 2017-09-03 MED ORDER — BISACODYL 10 MG RE SUPP
10.0000 mg | Freq: Every day | RECTAL | Status: DC | PRN
  Administered 2017-09-03: 10 mg via RECTAL
  Filled 2017-09-03: qty 1

## 2017-09-03 NOTE — Progress Notes (Signed)
LTCS honeycomb dressing is changed with aseptic technique. Pt tolerated procedure. Dulcolax suppository per rectum per pt request and order.

## 2017-09-03 NOTE — Lactation Note (Signed)
This note was copied from a baby's chart. Lactation Consultation Note  Patient Name: Rachel Welch WUJWJ'XToday's Date: 09/03/2017 Reason for consult: Follow-up assessment;Engorgement;Early term 3637-38.6wks;Infant weight loss   Visited with P2 Mom of ET baby at 267 hrs old. Baby at 8% weight loss.  Mom's breasts are both becoming full and on the verge of engorged.   Baby unwrapped and positioned to breastfeed.  Mom using cradle.  Assisted with using cross cradle hold.  Baby latched deeply and regular swallowing heard.   Mom to breastfeed baby on both breasts.  RN to provide ice to both breasts for 20 mins, and Mom to double pump 15 mins repeating as needed to soften breasts.    Mom has a history with her 753 yr old, of severe engorgement, a difficult latch, and exclusive pumping for 2 months only.    Mom to call prn for assistance.   Feeding Feeding Type: Breast Fed  LATCH Score Latch: Grasps breast easily, tongue down, lips flanged, rhythmical sucking.  Audible Swallowing: Spontaneous and intermittent  Type of Nipple: Everted at rest and after stimulation  Comfort (Breast/Nipple): Filling, red/small blisters or bruises, mild/mod discomfort  Hold (Positioning): Assistance needed to correctly position infant at breast and maintain latch.  LATCH Score: 8  Interventions Interventions: Breast feeding basics reviewed;Assisted with latch;Skin to skin;Breast massage;Hand express;Breast compression;Adjust position;Support pillows;Position options;DEBP;Hand pump;Ice;Expressed milk  Lactation Tools Discussed/Used     Consult Status Consult Status: Follow-up Date: 09/04/17 Follow-up type: In-patient    Judee ClaraSmith, Pier Bosher E 09/03/2017, 10:59 PM

## 2017-09-03 NOTE — Progress Notes (Signed)
Subjective: Postpartum Day 2: Cesarean Delivery Patient reports incisional pain and tolerating PO.   She reports constipation.  Objective: Vital signs in last 24 hours: Temp:  [98.3 F (36.8 C)-98.6 F (37 C)] 98.6 F (37 C) (06/03 0500) Pulse Rate:  [80-97] 97 (06/03 0500) Resp:  [17-18] 18 (06/03 0500) BP: (99-100)/(62-78) 100/62 (06/03 0500) SpO2:  [100 %] 100 % (06/03 0500)  Physical Exam:  General: alert, cooperative and no distress Lochia: appropriate Uterine Fundus: firm Incision: honeycomb dressing blood stained 3/4th... DVT Evaluation: No evidence of DVT seen on physical exam.  Recent Labs    09/01/17 0100 09/01/17 0610  HGB 12.5 11.6*  HCT 37.3 34.1*    Assessment/Plan: Status post Cesarean section. Doing well postoperatively.  Constipation - dulcolax suppository .Marland Kitchen. Encourage ambulation Pain control - motrin and percocet  Change honeycomb dressing  D/C home tomorrow .  Rachel Welch J. 09/03/2017, 1:10 PM

## 2017-09-04 ENCOUNTER — Inpatient Hospital Stay (HOSPITAL_COMMUNITY): Admission: AD | Admit: 2017-09-04 | Payer: 59 | Source: Ambulatory Visit | Admitting: Obstetrics and Gynecology

## 2017-09-04 ENCOUNTER — Other Ambulatory Visit: Payer: Self-pay

## 2017-09-04 MED ORDER — OXYCODONE-ACETAMINOPHEN 5-325 MG PO TABS: 1.0000 | ORAL_TABLET | ORAL | 0 refills | Status: AC | PRN

## 2017-09-04 MED ORDER — IBUPROFEN 600 MG PO TABS: 600.0000 mg | ORAL_TABLET | Freq: Four times a day (QID) | ORAL | 1 refills | Status: AC | PRN

## 2017-09-04 NOTE — Anesthesia Postprocedure Evaluation (Signed)
Anesthesia Post Note  Patient: Rachel MilesElizabeth A Welch  Procedure(s) Performed: REPEAT CESAREAN SECTION (N/A )     Patient location during evaluation: PACU Anesthesia Type: Spinal Level of consciousness: awake and alert Pain management: pain level controlled Vital Signs Assessment: post-procedure vital signs reviewed and stable Respiratory status: spontaneous breathing and respiratory function stable Cardiovascular status: blood pressure returned to baseline and stable Postop Assessment: spinal receding Anesthetic complications: no    Last Vitals:  Vitals:   09/03/17 1841 09/04/17 0548  BP: 99/68 101/69  Pulse: 95 92  Resp: 18 18  Temp: 36.9 C 36.7 C  SpO2: 100%     Last Pain:  Vitals:   09/04/17 1100  TempSrc:   PainSc: 4                  Lewie LoronJohn Ruther Ephraim

## 2017-09-04 NOTE — Lactation Note (Addendum)
This note was copied from a baby's chart. Lactation Consultation Note  Patient Name: Rachel Welch ZOXWR'UToday's Date: 09/04/2017     Maternal Data    Feeding Feeding Type: Breast Fed Length of feed: 10 min   P2, Baby 79 hours old.  Mother is tearful and engorged.  Gave mother new ice packs. Offered to view latch today and help if desired.  She was engorged with her first child for 2 weeks and first baby she states he was not breastfeeding well. She is concerned she is going down the same path again and is emotional. Mother knows this baby is breastfeeding better than first and has gained weight.   Gave information on how to use cabbage leaves to decrease her milk supply if mother chooses. Provided engorgement care tips and mother asked if she decided to not breastfeed how would she stop. Encouraged her to only pump for 5-7 min if baby is feeding well indicated by weight gain. Also suggest she lie flat and massage breasts toward axilla and clavicle - reverse pressure softening.       LATCH Score                   Interventions    Lactation Tools Discussed/Used     Consult Status      Dahlia ByesBerkelhammer, Djuna Frechette [facility] DivisionBoschen 09/04/2017, 10:14 AM

## 2017-09-04 NOTE — Progress Notes (Signed)
Ms Rachel Welch is very teary and verbalizes her concerns about engorgement. Support is given. Lactation consultant has talked to Rachel Welch about engorgement, pumping & breast feeding. Dr Richardson Doppole, lactation and nursing staff support mother with feeding choice for her baby. We spoke about PPD and availability for social worker to see her today to provide resources.  Rachel Welch declines seeing social worker at this time.  The plan is for Rachel Welch to try to sleep. Infant will be fed Br milk  via curved tip syringe while mother sleeps. FOB is present and very supportive.

## 2017-09-04 NOTE — Discharge Summary (Signed)
OB Discharge Summary     Patient Name: Rachel Welch DOB: 1980-04-10 MRN: [id number]  Date of admission: 08/31/2017 Delivering MD: Rachel Welch   Date of discharge: 09/04/2017  Admitting diagnosis: 38 WKS CTX  Intrauterine pregnancy: [redacted]w[redacted]d     Secondary diagnosis:  Active Problems:   Labor without complication   Status post repeat low transverse cesarean section  Additional problems: None     Discharge diagnosis: Term Pregnancy Delivered                                                                                                Post partum procedures:None  Augmentation: NA  Complications: None  Hospital course:  Sceduled C/S   37 y.o. yo G2P2002 at [redacted]w[redacted]d was admitted to the hospital 08/31/2017 for scheduled cesarean section with the following indication:Elective Repeat.  Membrane Rupture Time/Date: 2:56 AM ,09/01/2017   Patient delivered a Viable infant.09/01/2017  Details of operation can be found in separate operative note.  Pateint had an uncomplicated postpartum course.  She is ambulating, tolerating a regular diet, passing flatus, and urinating well. Patient is discharged home in stable condition on  09/04/17         Physical exam  Vitals:   09/02/17 1849 09/03/17 0500 09/03/17 1841 09/04/17 0548  BP: 99/78 100/62 99/68 101/69  Pulse: 80 97 95 92  Resp: 17 18 18 18   Temp: 98.3 F (36.8 C) 98.6 F (37 C) 98.4 F (36.9 C) 98 F (36.7 C)  TempSrc: Oral  Oral Oral  SpO2: 100% 100% 100%   Weight:      Height:       General: alert, cooperative and no distress Lochia: appropriate Uterine Fundus: firm Incision: N/A DVT Evaluation: No evidence of DVT seen on physical exam. Labs: Lab Results  Component Value Date   WBC 16.5 (H) 09/01/2017   HGB 11.6 (L) 09/01/2017   HCT 34.1 (L) 09/01/2017   MCV 97.4 09/01/2017   PLT 109 (L) 09/01/2017   No flowsheet data found.  Discharge instruction: per After Visit Summary and "Baby and Me Booklet".  After visit  meds:  Allergies as of 09/04/2017      Reactions   Penicillins Rash   Childhood reaction Has patient had a PCN reaction causing immediate rash, facial/tongue/throat swelling, SOB or lightheadedness with hypotension: unkn Has patient had a PCN reaction causing severe rash involving mucus membranes or skin necrosis: unkn Has patient had a PCN reaction that required hospitalization: unkn Has patient had a PCN reaction occurring within the last 10 years: no If all of the above answers are "NO", then may proceed with Cephalosporin use.   Sulfa Antibiotics Rash   Childhood reaction      Medication List    STOP taking these medications   acetaminophen 500 MG tablet Commonly known as:  TYLENOL     TAKE these medications   famotidine 20 MG tablet Commonly known as:  PEPCID Take 1 tablet (20 mg total) by mouth 2 (two) times daily.   ibuprofen 600 MG tablet Commonly known as:  ADVIL,MOTRIN Take 1 tablet (  600 mg total) by mouth every 6 (six) hours as needed.   OVER THE COUNTER MEDICATION Take 10 mLs by mouth daily. Powdered Magnesium   oxyCODONE-acetaminophen 5-325 MG tablet Commonly known as:  PERCOCET/ROXICET Take 1-2 tablets by mouth every 4 (four) hours as needed (pain scale > 7).   prenatal multivitamin Tabs tablet Take 1 tablet by mouth daily.   PROBIOTIC PO Take 1 capsule by mouth daily.   vitamin C 500 MG tablet Commonly known as:  ASCORBIC ACID Take 500 mg by mouth daily.   Vitamin D3 5000 units Tabs Take 1 tablet by mouth daily.   zinc gluconate 50 MG tablet Take 50 mg by mouth daily.       Diet: routine diet  Activity: Advance as tolerated. Pelvic rest for 6 weeks.   Outpatient follow up:2 weeks Follow up Appt:No future appointments. Follow up Visit:No follow-ups on file.  Postpartum contraception: Not Discussed  Newborn Data: Live born female  Birth Weight: 7 lb 7 oz (3374 g) APGAR: 8, 9  Newborn Delivery   Birth date/time:  09/01/2017  02:58:00 Delivery type:  C-Section, Low Transverse Trial of labor:  No C-section categorization:  Repeat     Baby Feeding: Breast Disposition:home with mother   09/04/2017 Jessee AversOLE,[name], MD

## 2017-10-01 DIAGNOSIS — R42 Dizziness and giddiness: Secondary | ICD-10-CM | POA: Diagnosis not present

## 2017-10-10 ENCOUNTER — Encounter: Payer: Self-pay | Admitting: Physical Therapy

## 2017-10-10 ENCOUNTER — Other Ambulatory Visit: Payer: Self-pay

## 2017-10-10 ENCOUNTER — Ambulatory Visit: Payer: 59 | Attending: Internal Medicine | Admitting: Physical Therapy

## 2017-10-10 DIAGNOSIS — R2681 Unsteadiness on feet: Secondary | ICD-10-CM | POA: Insufficient documentation

## 2017-10-10 DIAGNOSIS — R42 Dizziness and giddiness: Secondary | ICD-10-CM | POA: Insufficient documentation

## 2017-10-10 NOTE — Patient Instructions (Signed)
Gaze Stabilization: Tip Card  1.Target must remain in focus, not blurry, and appear stationary while head is in motion. 2.Perform exercises with small head movements (45 to either side of midline). 3.Increase speed of head motion so long as target is in focus. 4.If you wear eyeglasses, be sure you can see target through lens (therapist will give specific instructions for bifocal / progressive lenses). 5.These exercises may provoke dizziness or nausea. Work through these symptoms up to a 5 out of 10 level. If too dizzy, slow head movement slightly. Rest between each exercise. 6.Exercises demand concentration; avoid distractions.     Special Instructions: Exercises may bring on mild to moderate symptoms of dizziness/rocking that resolve within 30 minutes of completing exercises. If symptoms are lasting longer than 30 minutes, modify your exercises by:  >decreasing the # of times you complete each activity >ensuring your symptoms return to baseline before moving onto the next exercise >dividing up exercises so you do not do them all in one session, but multiple short sessions throughout the day >doing them once a day until symptoms improve   Begin seated and focusing on "E" approximately 3 feet away on a solid background. Focus on "E" until it is NOT moving/rocking and hold focus on it for up to 30 seconds. Once you can focus on the E for 30 seconds without it moving, can move to next step.     Gaze Stabilization: Sitting    Keeping eyes on target on wall 3 feet away, and move head side to side for _30___ seconds. Rest and repeat 2 more times. Then repeat while moving head up and down for __30-60__ seconds. Rest and repeat 2 more times.   Do __2-3__ sessions per day.  When you can do two days of seated exercise for 30 seconds without symptoms more than a 5, then progress to standing.     Gaze Stabilization - Tip Card  For safety, perform standing exercises close to a counter,  wall, corner, or next to someone.   Gaze Stabilization - Standing Feet Apart   Feet shoulder width apart, move head side to side for _30___ seconds. Rest and repeat 2 more times. Then repeat while moving head up and down for __30-60__ seconds. Rest and repeat 2 more times.   Do __2-3__ sessions per day.   Progress to feet together. Progress to standing on a pillow with feet apart Pillow feet together Pillow feet heel-toe position

## 2017-10-10 NOTE — Therapy (Signed)
[facility] [address] [place], [place], [address] Phone: [phone]   Fax:  [phone]  Physical Therapy Evaluation  Patient Details  Name: Rachel Welch MRN: [id number] Date of Birth: 10-30-1980 Referring Provider: Georgann HousekeeperHusain, Karrar, MD   Encounter Date: 10/10/2017  PT End of Session - 10/10/17 1301    Visit Number  1    Number of Visits  4    Date for PT Re-Evaluation  01/08/18    Authorization Type  [employer]; 30 VL (soft cap) 100% covered    PT Start Time  1101    PT Stop Time  1210    PT Time Calculation (min)  69 min    Activity Tolerance  Treatment limited secondary to medical complications (Comment) symptoms easily triggered and HEP modified significantly     Behavior During Therapy  -- tearful and crying       Past Medical History:  Diagnosis Date  . Dysrhythmia    occ skipped beat  . History of IBS   . Hx of abnormal cervical Pap smear   . Hx of endometriosis   . Vaginal Pap smear, abnormal   . Vertigo 2014   x 6 mos    Past Surgical History:  Procedure Laterality Date  . CESAREAN SECTION N/A 09/29/2014   Procedure: CESAREAN SECTION;  Surgeon: Gerald Leitzara Cole, MD;  Location: [facility];  Service: Obstetrics;  Laterality: N/A;  . CESAREAN SECTION N/A 09/01/2017   Procedure: REPEAT CESAREAN SECTION;  Surgeon: Gerald Leitzole, Tara, MD;  [facility];  Service: Obstetrics;  Laterality: N/A;  . COLONOSCOPY    . DIAGNOSTIC LAPAROSCOPY    . DILATION AND EVACUATION  04/17/2011   Procedure: DILATATION AND EVACUATION;  Surgeon: Dorien Chihuahuaara J. Richardson Doppole, MD;  Location: [facility];  Service: Gynecology;  Laterality: N/A;  with ultrasound guidance    There were no vitals filed for this visit.   Subjective Assessment - 10/10/17 1105    Subjective  08/31/17 symptoms began; woke up with vertigo (recognized the feeling from previous episode) feels like she has been on a boat (neither time was she on a boat prior to symptoms),  symptoms worse when looking down and trying to focus on something she's working on (make a sandwich, wash bottles); mostly side to side feeling of rocking but sometimes when she 's walking it feels like the ground dips; no falls or near falls. Most of the time can focus and not lose her balance, so she has felt safe carrying her 441 month old infant. Has not been able to get down and play cars with her 113 yo son. Yesterday was one of her worst days and she got her menstrual cycle--wondering if hormonal link.     Pertinent History  IBS, c-section 09/01/17    Limitations  Walking;House hold activities    Diagnostic tests  had brain MRI 5 yrs ago with similar symptoms and was negative    Patient Stated Goals  feel normal again    Currently in Pain?  No/denies         [facility] - 10/10/17 0001      Assessment   Medical Diagnosis  Vertigo, dizziness    Referring Provider  Georgann HousekeeperHusain, Karrar, MD    Onset Date/Surgical Date  08/31/17    Prior Therapy  5 years ago for similar problem       Precautions   Precautions  Fall      Balance Screen  Has the patient fallen in the past 6 months  No      Prior Function   Level of Independence  Independent    Vocation  Part time employment Retail banker; on maternity leave for another 2 weeks      Cognition   Overall Cognitive Status  Impaired/Different from baseline describes "brain fog"    Area of Impairment  Memory;Attention           Vestibular Assessment - 10/10/17 0001      Symptom Behavior   Type of Dizziness  Comment rocking    Frequency of Dizziness  daily    Duration of Dizziness  nearly all day; less so in the car    Aggravating Factors  Activity in general;Comment looking down to focus on objects    Relieving Factors  Comments riding in the car lessens      Occulomotor Exam   Occulomotor Alignment  Normal    Spontaneous  Absent    Gaze-induced  Absent      Vestibulo-Occular Reflex   Comment  HIT negative bil; 5 second  delay after test symptoms increased and then subsided in <60 seconds          Objective measurements completed on examination: See above findings.              PT Education - 10/10/17 1256    Education Details  similar symptoms as mal de debarquement; limited evidence for whether vestibular rehab assists with shortening duration of symptoms; educated on HEP and progression of exercises; explained rationale for exercises; discussed most importantly she needs to get more sleep and reduce stress (offered multiple suggestions to consider for making this happen with newborn and 68 yo and husband returning to work)    Teacher, music) Educated  Patient    Methods  Explanation;Demonstration;Verbal cues;Handout    Comprehension  Verbalized understanding;Returned demonstration;Need further instruction;Verbal cues required       PT Short Term Goals - 10/10/17 1448      PT SHORT TERM GOAL #1   Title  Patient will be indpendent with initial HEP (Target 11/09/17)    Time  1    Period  Months    Status  New    Target Date  11/09/17      PT SHORT TERM GOAL #2   Title  Patient will complete motion/positional sensitivity exam to determine appropriate motions to use for habituation exercises to add to HEP.    Time  1    Period  Months    Status  New      PT SHORT TERM GOAL #3   Title  Patient will be able to name 3 strategies she has implemented to help her get more sleep and/or to reduce her stress/anxiety.     Time  1    Period  Months    Status  New        PT Long Term Goals - 10/10/17 1459      PT LONG TERM GOAL #1   Title  Patient will be independent with updated HEP and able to verbalize how to progress intensity of exercises as needed. (Target for LTGs 01/08/18)    Time  3    Period  Months    Status  New    Target Date  01/08/18      PT LONG TERM GOAL #2   Title  Patient will report symptoms reduced by >=50% compared to time of evaluation.  Time  3    Period  Months     Status  New             Plan - 10/10/17 1303    Clinical Impression Statement  Patient referred to OPPT due to onset of "vertigo, dizziness" per MD referral. Patient denies spinning episodes, however has persistent rocking sensation with occasional resulting imbalance. Although she was not on a boat prior to onset, she describes the feeling as being on a boat. She noted that symptoms began the day she went into labor and again worsened when her menstrual cycle started 10/09/17. Educated to discuss with her OB-GYN if they feel this could be related to changing hormones. Patient educated on importance of sleep, stress reduction, and limiting stimuli/distractions (including screen time) to allow time for brain to "rest and reset." All of these recommendations are very difficult for her to accomplish at this time as she has a newborn and 38 yo at home, her husband had to have surgery 2 weeks ago and he is returning to work next week. Patient educated in gaze stabilization exercises and compensatory strategies to use when lifting or carrying her infant (or other times when symptoms are nearing 10/10). Due to patient's current schedule and difficulty attending PT sessions with husband returning to work next week, pt given progression of VORx1 exercises to work on x 4 weeks and then return to PT for assessment and ?progression of HEP. Patient in agreement with this plan and aware she can call rehab office with any further questions she has.   (Pended)     History and Personal Factors relevant to plan of care:  PMH-IBS, c-section 09/01/17;  Personal-lack of support system, coping styles, unknown mechanism of injury  (Pended)     Clinical Presentation  Evolving  (Pended)     Clinical Presentation due to:  symptoms worsening; unknown diagnosis/cause  (Pended)     Clinical Decision Making  High  (Pended)     Rehab Potential  Fair  (Pended)  lack of diagnosis and evidence re: vestibular rehab     PT Frequency   Monthy  (Pended)     PT Duration  Other (comment)  (Pended)  90 days    PT Treatment/Interventions  ADLs/Self Care Home Management;Biofeedback;Neuromuscular re-education;Balance training;Therapeutic exercise;Patient/family education;Vestibular;Visual/perceptual remediation/compensation  (Pended)     PT Next Visit Plan  assess HEP tolerance and progress as approp  (Pended)     Recommended Other Services  consider eye examination (has been ~1 year since contacts updated)  (Pended)     Consulted and Agree with Plan of Care  Patient  (Pended)        Patient will benefit from skilled therapeutic intervention in order to improve the following deficits and impairments:  (P) Decreased activity tolerance, Decreased balance, Decreased cognition, Dizziness, Difficulty walking, Impaired vision/preception  Visit Diagnosis: Dizziness and giddiness - Plan: PT plan of care cert/re-cert  Unsteadiness on feet - Plan: PT plan of care cert/re-cert     Problem List Patient Active Problem List   Diagnosis Date Noted  . Labor without complication 09/01/2017  . Status post repeat low transverse cesarean section 09/01/2017  . S/P cesarean section 09/29/2014    Zena Amos, PT 10/10/2017, 3:07 PM  [facility] [address] [place], [place], [address] Phone: [phone]   Fax:  [phone]  Name: Rachel Welch MRN: [id number] Date of Birth: Jul 25, 1980

## 2017-11-05 ENCOUNTER — Ambulatory Visit: Payer: 59 | Admitting: Physical Therapy

## 2018-01-17 DIAGNOSIS — Z30431 Encounter for routine checking of intrauterine contraceptive device: Secondary | ICD-10-CM | POA: Diagnosis not present

## 2018-02-06 DIAGNOSIS — H10413 Chronic giant papillary conjunctivitis, bilateral: Secondary | ICD-10-CM | POA: Diagnosis not present

## 2018-02-11 DIAGNOSIS — H905 Unspecified sensorineural hearing loss: Secondary | ICD-10-CM | POA: Diagnosis not present

## 2018-02-11 DIAGNOSIS — R42 Dizziness and giddiness: Secondary | ICD-10-CM | POA: Diagnosis not present

## 2018-02-25 DIAGNOSIS — H905 Unspecified sensorineural hearing loss: Secondary | ICD-10-CM | POA: Diagnosis not present

## 2018-02-25 DIAGNOSIS — H832X1 Labyrinthine dysfunction, right ear: Secondary | ICD-10-CM | POA: Diagnosis not present

## 2018-02-25 DIAGNOSIS — R42 Dizziness and giddiness: Secondary | ICD-10-CM | POA: Diagnosis not present

## 2018-03-18 DIAGNOSIS — R262 Difficulty in walking, not elsewhere classified: Secondary | ICD-10-CM | POA: Diagnosis not present

## 2018-04-26 ENCOUNTER — Other Ambulatory Visit: Payer: Self-pay | Admitting: Internal Medicine

## 2018-04-26 ENCOUNTER — Ambulatory Visit
Admission: RE | Admit: 2018-04-26 | Discharge: 2018-04-26 | Disposition: A | Discharge status: Home / Self Care | Payer: 59 | Source: Ambulatory Visit | Attending: Internal Medicine | Admitting: Internal Medicine

## 2018-04-26 DIAGNOSIS — M25561 Pain in right knee: Secondary | ICD-10-CM

## 2018-04-26 DIAGNOSIS — M1711 Unilateral primary osteoarthritis, right knee: Secondary | ICD-10-CM | POA: Diagnosis not present

## 2020-01-16 ENCOUNTER — Other Ambulatory Visit: Payer: Self-pay | Admitting: Obstetrics and Gynecology

## 2020-01-16 DIAGNOSIS — Z0000 Encounter for general adult medical examination without abnormal findings: Secondary | ICD-10-CM

## 2020-02-20 ENCOUNTER — Ambulatory Visit: Payer: 59

## 2020-04-16 ENCOUNTER — Other Ambulatory Visit: Payer: Self-pay

## 2020-04-16 ENCOUNTER — Ambulatory Visit
Admission: RE | Admit: 2020-04-16 | Discharge: 2020-04-16 | Disposition: A | Discharge status: Home / Self Care | Payer: 59 | Source: Ambulatory Visit | Attending: Obstetrics and Gynecology | Admitting: Obstetrics and Gynecology

## 2020-04-16 DIAGNOSIS — Z0000 Encounter for general adult medical examination without abnormal findings: Secondary | ICD-10-CM

## 2021-06-22 ENCOUNTER — Other Ambulatory Visit: Payer: Self-pay | Admitting: Obstetrics and Gynecology

## 2021-06-22 DIAGNOSIS — Z1231 Encounter for screening mammogram for malignant neoplasm of breast: Secondary | ICD-10-CM

## 2021-07-06 ENCOUNTER — Ambulatory Visit
Admission: RE | Admit: 2021-07-06 | Discharge: 2021-07-06 | Disposition: A | Discharge status: Home / Self Care | Payer: 59 | Source: Ambulatory Visit | Attending: Obstetrics and Gynecology | Admitting: Obstetrics and Gynecology

## 2021-07-06 DIAGNOSIS — Z1231 Encounter for screening mammogram for malignant neoplasm of breast: Secondary | ICD-10-CM

## 2022-06-23 ENCOUNTER — Other Ambulatory Visit: Payer: Self-pay | Admitting: Obstetrics & Gynecology

## 2022-06-23 DIAGNOSIS — Z1231 Encounter for screening mammogram for malignant neoplasm of breast: Secondary | ICD-10-CM

## 2022-08-09 ENCOUNTER — Ambulatory Visit: Payer: 59

## 2022-09-06 ENCOUNTER — Ambulatory Visit: Payer: 59

## 2022-09-07 ENCOUNTER — Ambulatory Visit
Admission: RE | Admit: 2022-09-07 | Discharge: 2022-09-07 | Disposition: A | Discharge status: Home / Self Care | Payer: 59 | Source: Ambulatory Visit | Attending: Obstetrics & Gynecology | Admitting: Obstetrics & Gynecology

## 2022-09-07 DIAGNOSIS — Z1231 Encounter for screening mammogram for malignant neoplasm of breast: Secondary | ICD-10-CM

## 2022-09-11 ENCOUNTER — Other Ambulatory Visit: Payer: Self-pay | Admitting: Obstetrics & Gynecology

## 2022-09-11 DIAGNOSIS — R928 Other abnormal and inconclusive findings on diagnostic imaging of breast: Secondary | ICD-10-CM

## 2022-09-16 ENCOUNTER — Ambulatory Visit: Admission: RE | Admit: 2022-09-16 | Payer: 59 | Source: Ambulatory Visit

## 2022-09-16 ENCOUNTER — Other Ambulatory Visit: Payer: 59

## 2022-09-16 ENCOUNTER — Ambulatory Visit
Admission: RE | Admit: 2022-09-16 | Discharge: 2022-09-16 | Disposition: A | Discharge status: Home / Self Care | Payer: 59 | Source: Ambulatory Visit | Attending: Obstetrics & Gynecology | Admitting: Obstetrics & Gynecology

## 2022-09-16 DIAGNOSIS — R928 Other abnormal and inconclusive findings on diagnostic imaging of breast: Secondary | ICD-10-CM

## 2023-06-04 IMAGING — MG MM DIGITAL SCREENING BILAT W/ TOMO AND CAD
6 of 10 series · 6 of 30 positions shown · non-contrast
Comparison: Previous exam(s).

CLINICAL DATA: Screening.

EXAM:
DIGITAL SCREENING BILATERAL MAMMOGRAM WITH TOMOSYNTHESIS AND CAD
TECHNIQUE: Bilateral screening digital craniocaudal and mediolateral oblique
mammograms were obtained. Bilateral screening digital breast
tomosynthesis was performed. The images were evaluated with
computer-aided detection.

[R CC synth-2D]
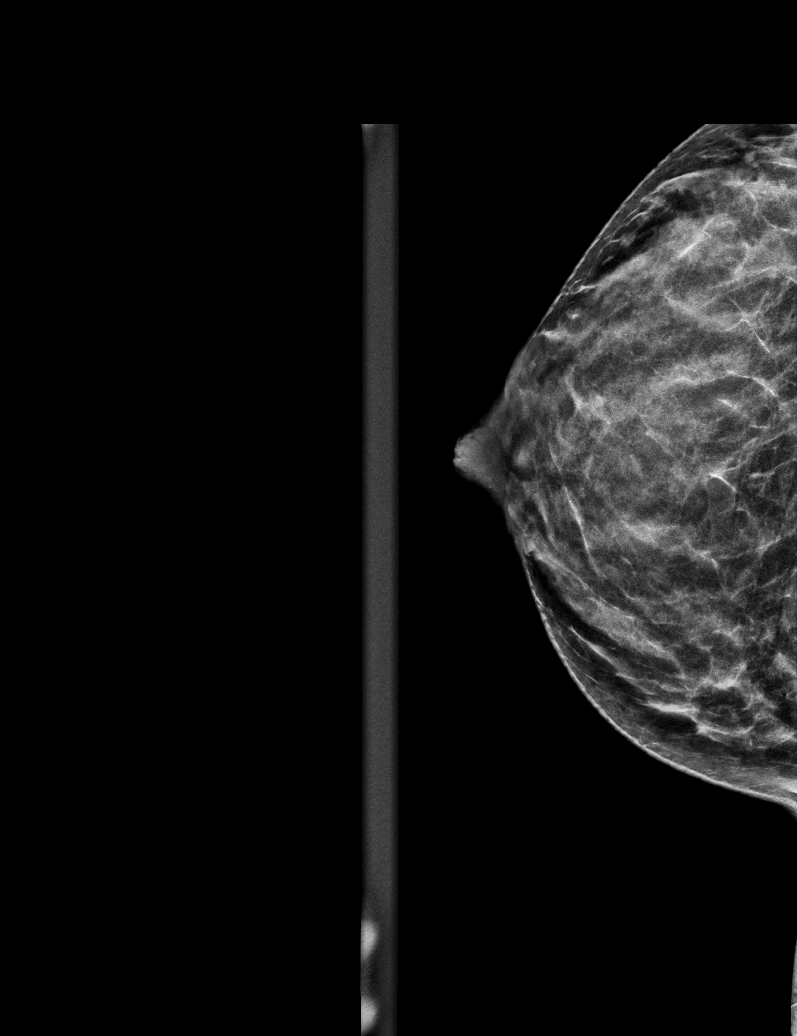

[L MLO synth-2D (1 of 2)]
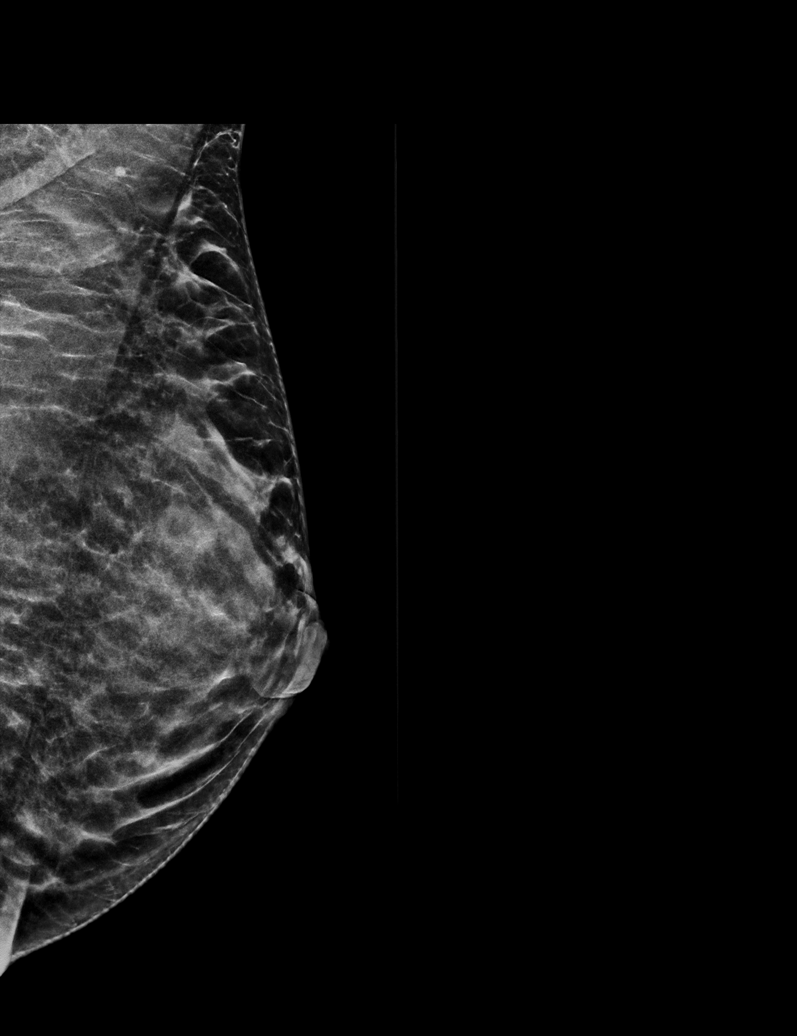

[L CC synth-2D]
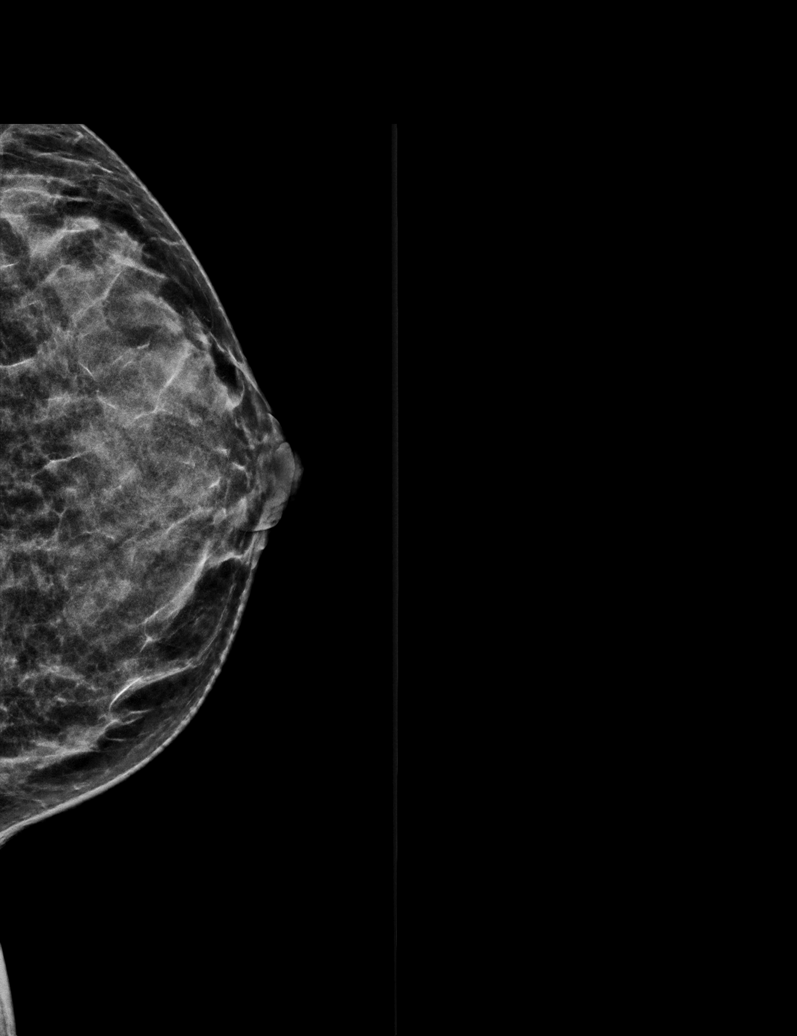

[R MLO synth-2D]
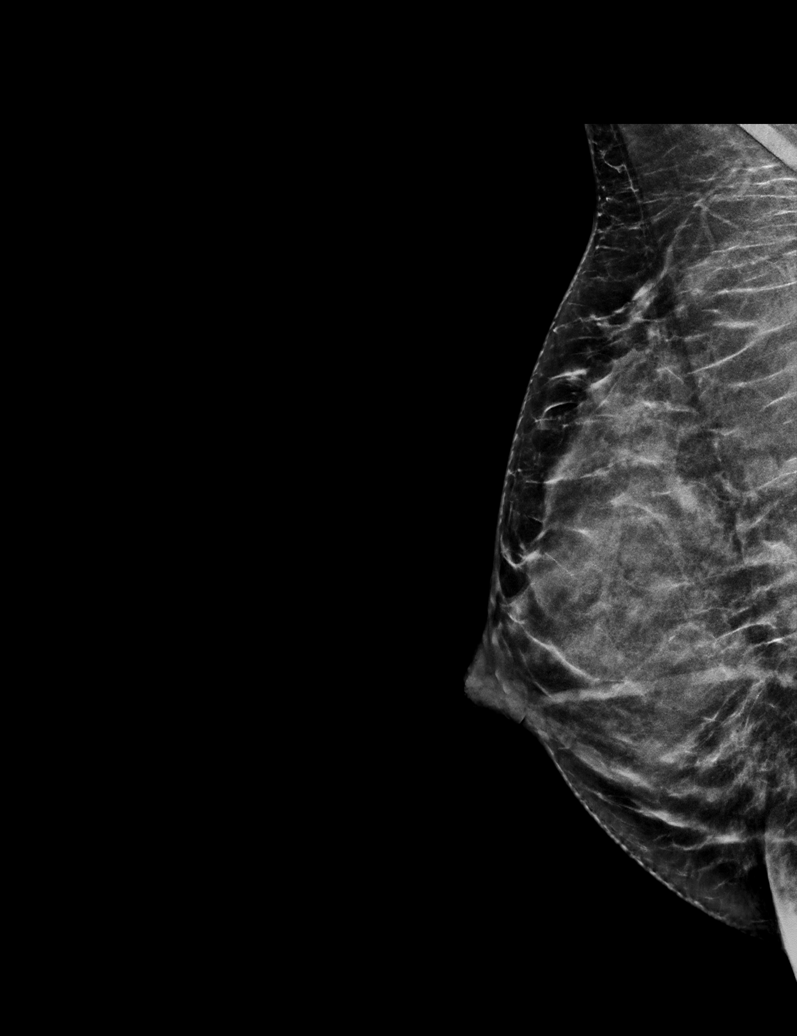

[L MLO synth-2D (2 of 2)]
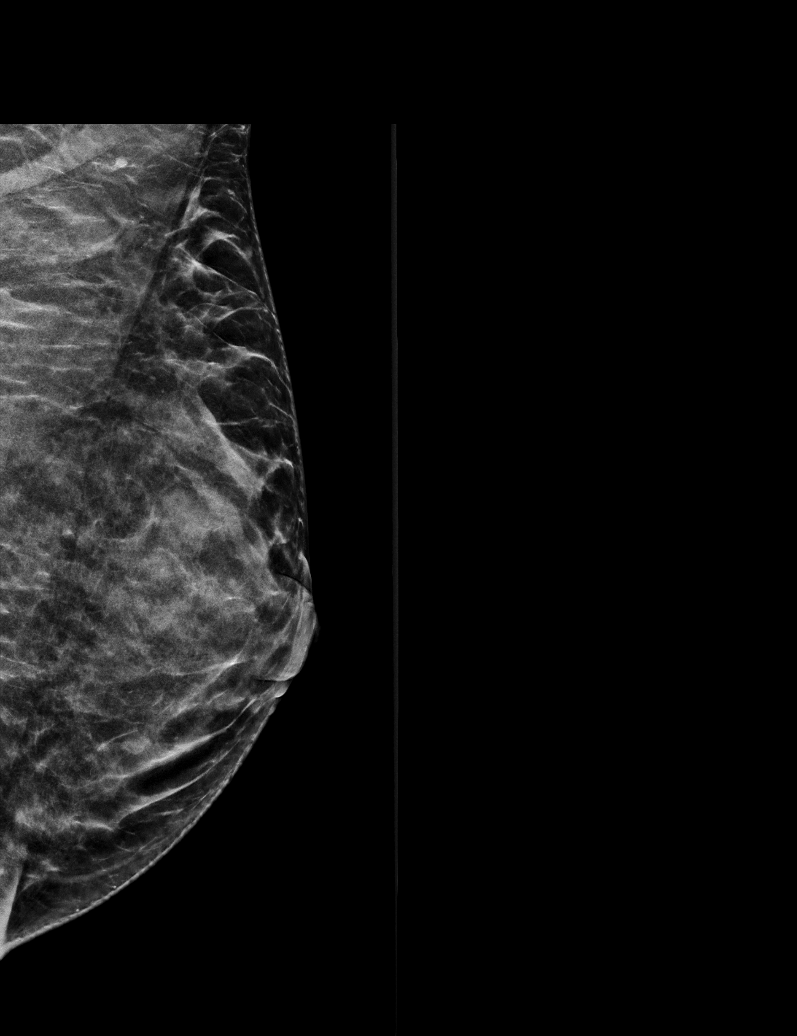

[R MLO tomo · tomo slice 27/54.0]
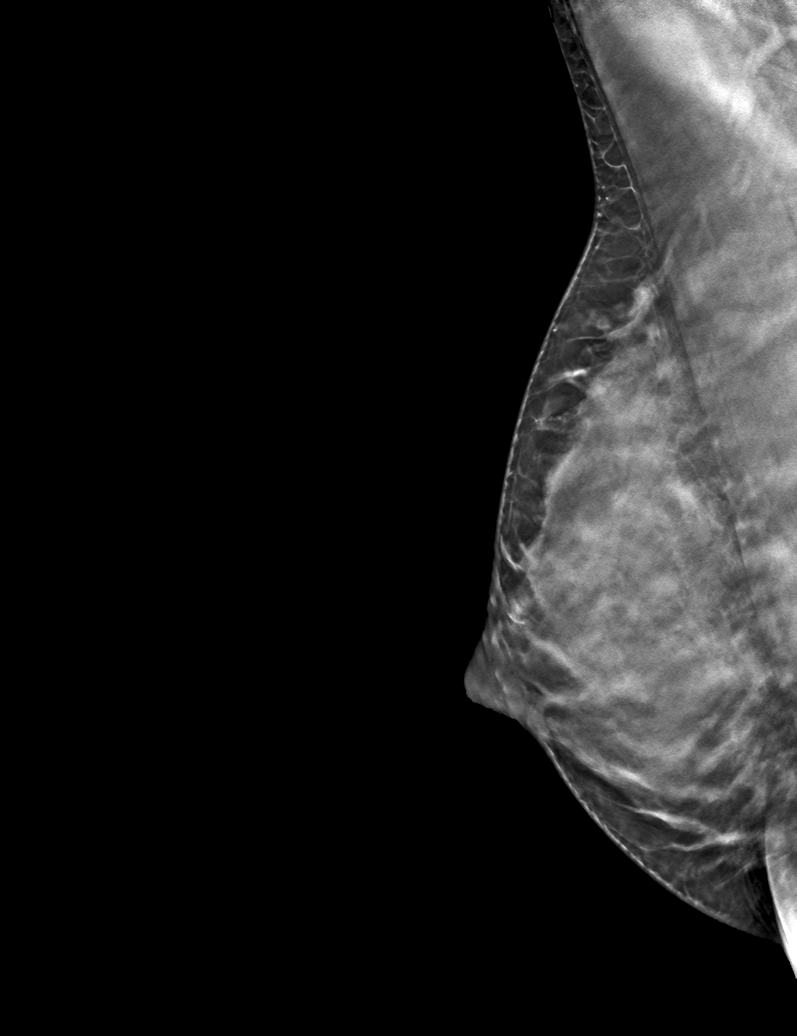

[6 of 30 positions shown; findings below may reference images not displayed]

ACR Breast Density Category d: The breast tissue is extremely dense,
which lowers the sensitivity of mammography
FINDINGS: There are no findings suspicious for malignancy.
IMPRESSION: No mammographic evidence of malignancy. A result letter of this
screening mammogram will be mailed directly to the patient.

RECOMMENDATION:
Screening mammogram in one year. (Code:[id number])

BI-RADS CATEGORY  1: Negative.

## 2023-10-07 ENCOUNTER — Other Ambulatory Visit: Payer: Self-pay | Admitting: Medical Genetics

## 2023-10-09 ENCOUNTER — Other Ambulatory Visit (HOSPITAL_COMMUNITY)
Admission: RE | Admit: 2023-10-09 | Discharge: 2023-10-09 | Disposition: A | Discharge status: Home / Self Care | Payer: Self-pay | Source: Ambulatory Visit | Attending: Medical Genetics | Admitting: Medical Genetics

## 2023-10-14 ENCOUNTER — Other Ambulatory Visit (HOSPITAL_BASED_OUTPATIENT_CLINIC_OR_DEPARTMENT_OTHER): Payer: Self-pay | Admitting: Internal Medicine

## 2023-10-14 ENCOUNTER — Ambulatory Visit (HOSPITAL_BASED_OUTPATIENT_CLINIC_OR_DEPARTMENT_OTHER)
Admission: RE | Admit: 2023-10-14 | Discharge: 2023-10-14 | Disposition: A | Discharge status: Home / Self Care | Source: Ambulatory Visit | Attending: Internal Medicine | Admitting: Internal Medicine

## 2023-10-14 DIAGNOSIS — Z1231 Encounter for screening mammogram for malignant neoplasm of breast: Secondary | ICD-10-CM

## 2023-10-19 LAB — GENECONNECT MOLECULAR SCREEN: Genetic Analysis Overall Interpretation: NEGATIVE
